# Patient Record
Sex: Female | Born: 1960
Health system: Southern US, Community
[De-identification: ages and names within clinical notes are randomized; demographics above are authoritative.]

## PROBLEM LIST (undated history)

## (undated) DIAGNOSIS — F32A Depression, unspecified: Secondary | ICD-10-CM

## (undated) DIAGNOSIS — R61 Generalized hyperhidrosis: Secondary | ICD-10-CM

## (undated) DIAGNOSIS — C801 Malignant (primary) neoplasm, unspecified: Secondary | ICD-10-CM

## (undated) DIAGNOSIS — F329 Major depressive disorder, single episode, unspecified: Secondary | ICD-10-CM

## (undated) DIAGNOSIS — M545 Low back pain, unspecified: Secondary | ICD-10-CM

## (undated) DIAGNOSIS — K219 Gastro-esophageal reflux disease without esophagitis: Secondary | ICD-10-CM

## (undated) DIAGNOSIS — O021 Missed abortion: Secondary | ICD-10-CM

## (undated) HISTORY — PX: COLONOSCOPY: SHX174

## (undated) HISTORY — DX: Generalized hyperhidrosis: R61

## (undated) HISTORY — DX: Depression, unspecified: F32.A

## (undated) HISTORY — DX: Major depressive disorder, single episode, unspecified: F32.9

## (undated) HISTORY — DX: Malignant (primary) neoplasm, unspecified: C80.1

## (undated) HISTORY — DX: Gastro-esophageal reflux disease without esophagitis: K21.9

## (undated) HISTORY — PX: WISDOM TOOTH EXTRACTION: SHX21

## (undated) HISTORY — PX: UPPER GASTROINTESTINAL ENDOSCOPY: SHX188

---

## 1982-12-06 HISTORY — PX: DILATION AND CURETTAGE OF UTERUS: SHX78

## 1997-02-19 HISTORY — PX: TUBAL LIGATION: SHX77

## 2009-08-02 ENCOUNTER — Ambulatory Visit: Payer: Self-pay | Admitting: Family Medicine

## 2009-08-02 DIAGNOSIS — F411 Generalized anxiety disorder: Secondary | ICD-10-CM | POA: Insufficient documentation

## 2010-02-02 ENCOUNTER — Ambulatory Visit: Payer: Self-pay | Admitting: Occupational Medicine

## 2010-07-07 ENCOUNTER — Ambulatory Visit: Payer: Self-pay | Admitting: Family Medicine

## 2010-07-07 DIAGNOSIS — F329 Major depressive disorder, single episode, unspecified: Secondary | ICD-10-CM

## 2010-07-07 DIAGNOSIS — G4700 Insomnia, unspecified: Secondary | ICD-10-CM | POA: Insufficient documentation

## 2010-07-07 DIAGNOSIS — F3289 Other specified depressive episodes: Secondary | ICD-10-CM | POA: Insufficient documentation

## 2010-09-22 ENCOUNTER — Ambulatory Visit: Payer: Self-pay | Admitting: Family Medicine

## 2011-01-05 NOTE — Assessment & Plan Note (Signed)
Summary: NOV: Anxiety, insomnia   Vital Signs:  Patient profile:   50 year old female Height:      63 inches Weight:      165 pounds BMI:     29.33 Pulse rate:   77 / minute BP sitting:   115 / 73  (left arm) Cuff size:   regular  Vitals Entered By: Avon Gully CMA, Duncan Dull) (July 07, 2010 2:14 PM) CC: NP est care   CC:  NP est care.  History of Present Illness: Has started counseling for anxiety and stress. Was on Lexapro for awhile and did well on it. But came off of it last October. Has had alot of stressors over the alst 2 years. Not sleeping well.  Getting severe HA. Husband snores and this keeps her awake. Will often get 2-3 hours of sleep.  Does feel depressed as well. She thinks she might want to go back on the Lexapro.   Habits & Providers  Alcohol-Tobacco-Diet     Alcohol drinks/day: <1     Tobacco Status: quit     Year Quit: 2001  Exercise-Depression-Behavior     Does Patient Exercise: yes     Type of exercise: walking     STD Risk: never     Drug Use: no     Seat Belt Use: always  Allergies: No Known Drug Allergies  Past History:  Past Medical History: Anxiety Hx of melanoma Hx of goiter during pregnancy, now resolved.   Past Surgical History: Tubal ligation 02/1997  Family History: Mother,D,COPD- she was a smoker.  Siblings with alcoholism Father with stroke, melanoma  Social History: Self employed.  [employer].  Some college.  Married to Ed wtih 3 kids.   Former Smoker, 2001 Alcohol use-yes occ. Drug use-no Regular exercise-yes 2-3 caffeinated drinks per day. Does Patient Exercise:  yes STD Risk:  never Seat Belt Use:  always  Review of Systems       No fever/sweats/weakness, unexplained weight loss/gain.  No vison changes.  No difficulty hearing/ringing in ears, + hay fever/allergies.  No chest pain/discomfort, palpitations.  No Br lump/nipple discharge.  No cough/wheeze.  No blood in BM, nausea/vomiting/diarrhea.  No nighttime  urination, leaking urine, unusual vaginal bleeding, discharge (penis or vagina).  No muscle/joint pain. No rash, change in mole.  + HA, no memory loss.  + anxiety, no sleep d/o, depression.  No easy bruising/bleeding, unexplained lump   Physical Exam  General:  Well-developed,well-nourished,in no acute distress; alert,appropriate and cooperative throughout examination Neck:  No deformities, masses, or tenderness noted. ON TM.  Lungs:  Normal respiratory effort, chest expands symmetrically. Lungs are clear to auscultation, no crackles or wheezes. Heart:  Normal rate and regular rhythm. S1 and S2 normal without gallop, murmur, click, rub or other extra sounds. Skin:  no rashes.   Cervical Nodes:  No lymphadenopathy noted Psych:  Cognition and judgment appear intact. Alert and cooperative with normal attention span and concentration. No apparent delusions, illusions, hallucinations   Impression & Recommendations:  Problem # 1:  ANXIETY (ICD-300.00) GAD- & score of 15  PHQ-9 score 21 (severe). I strongly encouraged her to restart her citalopram. Start with one half a day for one week and then increase to a whol. F/u in 3-4 weeks to make sure she is improving.  Continue seeing counseling as she feels this is really helping her.   Her updated medication list for this problem includes:    Citalopram Hydrobromide 20 Mg Tabs (Citalopram  hydrobromide) .Marland Kitchen... Take 1 tablet by mouth once a day  Problem # 2:  INSOMNIA (ICD-780.52) Reviewed sleep hygiene. Will restart her lexapro and see if this improves.It did  in the past with the lexapro. If not then consider other sleep aids.  She is also having hot flashes that are contributing to her insomnia. Can consider HRT as well.   Problem # 3:  DEPRESSION, SEVERE (ICD-311) See anxiety.  Her updated medication list for this problem includes:    Citalopram Hydrobromide 20 Mg Tabs (Citalopram hydrobromide) .Marland Kitchen... Take 1 tablet by mouth once a day  Complete  Medication List: 1)  Citalopram Hydrobromide 20 Mg Tabs (Citalopram hydrobromide) .... Take 1 tablet by mouth once a day 2)  Naproxen 500 Mg Tabs (Naproxen) .... One by mouth two times a day pc 3)  Lortab 5 5-500 Mg Tabs (Hydrocodone-acetaminophen) .... One or two tabs by mouth hs as needed pain 4)  Proair Hfa 108 (90 Base) Mcg/act Aers (Albuterol sulfate) .... Two inhalations q4-6hr as needed.  max 12 puffs/day  Patient Instructions: 1)  Call [phone] to schedule her mammogram. 2)  Please schedule a follow-up appointment in 3-4 weeks.  Prescriptions: CITALOPRAM HYDROBROMIDE 20 MG TABS (CITALOPRAM HYDROBROMIDE) Take 1 tablet by mouth once a day  #30 x 1   Entered and Authorized by:   Nani Gasser MD   Signed by:   Nani Gasser MD on 07/07/2010   Method used:   Electronically to        [facility]* (retail)       [address] [place], [place]  [address]       Ph: [phone]       Fax: [phone]   RxID:   [id number]

## 2011-01-05 NOTE — Assessment & Plan Note (Signed)
Summary: COUGH/KH   Vital Signs:  Patient Profile:   50 Years Old Female CC:      Cough x 2 days Height:     63 inches Weight:      160 pounds O2 Sat:      96 % O2 treatment:    Room Air Temp:     97.2 degrees F oral Pulse rate:   79 / minute Pulse rhythm:   regular Resp:     18 per minute BP sitting:   102 / 70  (right arm) Cuff size:   large  Vitals Entered By: Emilio Math (February 02, 2010 8:33 AM)                  Current Allergies (reviewed today): No known allergies History of Present Illness Chief Complaint: Cough x 2 days History of Present Illness: Presents with complaints of dry cough for the last 2 days.  No reports of fever.  Complatins of generalized body aches.   Compalints of ches tightness.   No wheezing.   No sinus congestion.  No ear pain.   No nausea, vomiting, diarrhea.    Coughing keeping her up at night.   No history of allergies or asthma.     REVIEW OF SYSTEMS Constitutional Symptoms      Denies fever, chills, night sweats, weight loss, weight gain, and fatigue.  Eyes       Denies change in vision, eye pain, eye discharge, glasses, contact lenses, and eye surgery. Ear/Nose/Throat/Mouth       Denies hearing loss/aids, change in hearing, ear pain, ear discharge, dizziness, frequent runny nose, frequent nose bleeds, sinus problems, sore throat, hoarseness, and tooth pain or bleeding.  Respiratory       Complains of dry cough.      Denies productive cough, wheezing, shortness of breath, asthma, bronchitis, and emphysema/COPD.  Cardiovascular       Denies murmurs, chest pain, and tires easily with exhertion.    Gastrointestinal       Denies stomach pain, nausea/vomiting, diarrhea, constipation, blood in bowel movements, and indigestion. Genitourniary       Denies painful urination, kidney stones, and loss of urinary control. Neurological       Denies paralysis, seizures, and fainting/blackouts. Musculoskeletal       Denies muscle pain, joint  pain, joint stiffness, decreased range of motion, redness, swelling, muscle weakness, and gout.  Skin       Denies bruising, unusual mles/lumps or sores, and hair/skin or nail changes.  Psych       Denies mood changes, temper/anger issues, anxiety/stress, speech problems, depression, and sleep problems.  Past History:  Past Medical History: Reviewed history from 08/02/2009 and no changes required. Anxiety  Past Surgical History: Reviewed history from 08/02/2009 and no changes required. Denies surgical history  Family History: Family History of Skin cancer Mother,D,COPD  Social History: Reviewed history from 08/02/2009 and no changes required. Former Smoker Alcohol use-yes occ. Drug use-no Physical Exam General appearance: well developed, well nourished, no acute distress Nasal: mucosa pink, nonedematous, no septal deviation, turbinates normal Oral/Pharynx: tongue normal, posterior pharynx without erythema or exudate Neck: neck supple,  trachea midline, no masses Chest/Lungs: no rales, wheezes, or rhonchi bilateral, breath sounds equal without effort Heart: regular rate and  rhythm, no murmur Assessment New Problems: BRONCHITIS, ACUTE (ICD-466.0)   Plan New Medications/Changes: PROAIR HFA 108 (90 BASE) MCG/ACT AERS (ALBUTEROL SULFATE) Two inhalations q4-6hr as needed.  Max 12 puffs/day  #1  MDI x 0, 02/02/2010, Kathrine Haddock MD CHERATUSSIN AC 100-10 MG/5ML SYRP (GUAIFENESIN-CODEINE) 5cc by mouth hs as needed cough  #2 oz x 0, 02/02/2010, Kathrine Haddock MD  New Orders: Est. Patient Level III [phone] Planning Comments:   albuterol as needed Cheratussin for cough   The patient and/or caregiver has been counseled thoroughly with regard to medications prescribed including dosage, schedule, interactions, rationale for use, and possible side effects and they verbalize understanding.  Diagnoses and expected course of recovery discussed and will return if not improved as  expected or if the condition worsens. Patient and/or caregiver verbalized understanding.  Prescriptions: PROAIR HFA 108 (90 BASE) MCG/ACT AERS (ALBUTEROL SULFATE) Two inhalations q4-6hr as needed.  Max 12 puffs/day  #1 MDI x 0   Entered and Authorized by:   Kathrine Haddock MD   Signed by:   Kathrine Haddock MD on 02/02/2010   Method used:   Print then Give to Patient   RxID:   [id number] CHERATUSSIN AC 100-10 MG/5ML SYRP (GUAIFENESIN-CODEINE) 5cc by mouth hs as needed cough  #2 oz x 0   Entered and Authorized by:   Kathrine Haddock MD   Signed by:   Kathrine Haddock MD on 02/02/2010   Method used:   Print then Give to Patient   RxID:   [id number]   Patient Instructions: 1)  Acute bronchitis symptoms for less than 10 days are not helped by antibiotics. take over the counter cough medications. call if no improvment in  5-7 days, sooner if increasing cough, fever, or new symptoms( shortness of breath, chest pain).

## 2011-01-05 NOTE — Assessment & Plan Note (Signed)
Summary: f/u on mood   Vital Signs:  Patient profile:   50 year old female Height:      63 inches Weight:      162 pounds Pulse rate:   87 / minute BP sitting:   101 / 69  (right arm) Cuff size:   regular  Vitals Entered By: Avon Gully CMA, Duncan Dull) (September 22, 2010 1:35 PM) CC: f/u mood, pt states med is working   CC:  f/u mood and pt states med is working.  History of Present Illness: f/u mood, pt states med is working well. No side effects. Have noticed im,provment in her mood.   Current Medications (verified): 1)  Citalopram Hydrobromide 20 Mg Tabs (Citalopram Hydrobromide) .... Take 1 Tablet By Mouth Once A Day 2)  Naproxen 500 Mg Tabs (Naproxen) .... One By Mouth Two Times A Day Pc 3)  Lortab 5 5-500 Mg Tabs (Hydrocodone-Acetaminophen) .... One or Two Tabs By Mouth Hs As Needed Pain  Allergies (verified): No Known Drug Allergies  Comments:  Nurse/Medical Assistant: The patient's medications and allergies were reviewed with the patient and were updated in the Medication and Allergy Lists. Avon Gully CMA, Duncan Dull) (September 22, 2010 1:35 PM)  Physical Exam  General:  Well-developed,well-nourished,in no acute distress; alert,appropriate and cooperative throughout examination Lungs:  Normal respiratory effort, chest expands symmetrically. Lungs are clear to auscultation, no crackles or wheezes. Heart:  Normal rate and regular rhythm. S1 and S2 normal without gallop, murmur, click, rub or other extra sounds. Skin:  no rashes.   Cervical Nodes:  No lymphadenopathy noted Psych:  Cognition and judgment appear intact. Alert and cooperative with normal attention span and concentration. No apparent delusions, illusions, hallucinations   Impression & Recommendations:  Problem # 1:  DEPRESSION, SEVERE (ICD-311) PHQ -9 score is 8 , down from 21.  F/u in 2 months.  Discussed option. Will inc her dose to 40mg .  Sleep is still poor but husband snores very loudly.   Her  updated medication list for this problem includes:    Citalopram Hydrobromide 40 Mg Tabs (Citalopram hydrobromide) .Marland Kitchen... Take 1 tablet by mouth once a day  Problem # 2:  ANXIETY (ICD-300.00) GAD - 7 score is 7.  Imprved from last time. INc dose to 40mg  and f/u in 2 months.  Her updated medication list for this problem includes:    Citalopram Hydrobromide 40 Mg Tabs (Citalopram hydrobromide) .Marland Kitchen... Take 1 tablet by mouth once a day  Complete Medication List: 1)  Citalopram Hydrobromide 40 Mg Tabs (Citalopram hydrobromide) .... Take 1 tablet by mouth once a day 2)  Naproxen 500 Mg Tabs (Naproxen) .... One by mouth two times a day pc 3)  Lortab 5 5-500 Mg Tabs (Hydrocodone-acetaminophen) .... One or two tabs by mouth hs as needed pain  Patient Instructions: 1)  Please schedule a follow-up appointment in 2 months for mood.   Contraindications/Deferment of Procedures/Staging:    Test/Procedure: FLU VAX    Reason for deferment: patient declined  Prescriptions: CITALOPRAM HYDROBROMIDE 40 MG TABS (CITALOPRAM HYDROBROMIDE) Take 1 tablet by mouth once a day  #90 x 0   Entered and Authorized by:   Nani Gasser MD   Signed by:   Nani Gasser MD on 09/22/2010   Method used:   Electronically to        [facility]* (retail)       [address] [place], [place]  [address]  Ph: [phone]       Fax: [phone]   RxID:   [id number]    Orders Added: 1)  Est. Patient Level III [62952]

## 2011-01-05 NOTE — Letter (Signed)
Summary: Depression & Anxiety Questionnaire  Depression & Anxiety Questionnaire   Imported By: Lanelle Bal 07/23/2010 12:24:11  _____________________________________________________________________  External Attachment:    Type:   Image     Comment:   External Document

## 2011-01-05 NOTE — Letter (Signed)
Summary: Depression & Anxiety Questionnaire  Depression & Anxiety Questionnaire   Imported By: Lanelle Bal 10/01/2010 11:17:20  _____________________________________________________________________  External Attachment:    Type:   Image     Comment:   External Document

## 2011-05-08 ENCOUNTER — Other Ambulatory Visit: Payer: Self-pay | Admitting: Family Medicine

## 2011-06-15 ENCOUNTER — Other Ambulatory Visit: Payer: Self-pay | Admitting: *Deleted

## 2013-01-16 ENCOUNTER — Encounter: Payer: Self-pay | Admitting: Obstetrics & Gynecology

## 2013-01-18 ENCOUNTER — Encounter: Payer: Self-pay | Admitting: Obstetrics & Gynecology

## 2013-02-07 ENCOUNTER — Encounter: Payer: Self-pay | Admitting: Obstetrics & Gynecology

## 2013-02-13 ENCOUNTER — Encounter: Payer: Self-pay | Admitting: Obstetrics & Gynecology

## 2013-02-22 ENCOUNTER — Encounter: Payer: Self-pay | Admitting: Obstetrics & Gynecology

## 2013-02-22 ENCOUNTER — Ambulatory Visit (INDEPENDENT_AMBULATORY_CARE_PROVIDER_SITE_OTHER): Payer: BC Managed Care – PPO | Admitting: Obstetrics & Gynecology

## 2013-02-22 VITALS — BP 122/79 | HR 80 | Resp 16 | Ht 63.0 in | Wt 167.0 lb

## 2013-02-22 DIAGNOSIS — Z1151 Encounter for screening for human papillomavirus (HPV): Secondary | ICD-10-CM

## 2013-02-22 DIAGNOSIS — Z124 Encounter for screening for malignant neoplasm of cervix: Secondary | ICD-10-CM

## 2013-02-22 DIAGNOSIS — N939 Abnormal uterine and vaginal bleeding, unspecified: Secondary | ICD-10-CM

## 2013-02-22 DIAGNOSIS — N926 Irregular menstruation, unspecified: Secondary | ICD-10-CM

## 2013-02-22 DIAGNOSIS — Z01419 Encounter for gynecological examination (general) (routine) without abnormal findings: Secondary | ICD-10-CM

## 2013-02-22 NOTE — Patient Instructions (Addendum)
Postmenopausal Bleeding Menopause is commonly referred to as the "change in life." It is a time when the fertile years, the time of ovulating and having menstrual periods, has come to an end. It is also determined by not having menstrual periods for 12 months.  Postmenopausal bleeding is any bleeding a woman has after she has entered into menopause. Any type of postmenopausal bleeding, even if it appears to be a typical menstrual period, is concerning. This should be evaluated by your caregiver.  CAUSES   Hormone therapy.  Cancer of the cervix or cancer of the lining of the uterus (endometrial cancer).  Thinning of the uterine lining (uterine atrophy).  Thyroid diseases.  Certain medicines.  Infection of the uterus or cervix.  Inflammation or irritation of the uterine lining (endometritis).  Estrogen-secreting tumors.  Growths (polyps) on the cervix, uterine lining, or uterus.  Uterine tumors (fibroids).  Being very overweight (obese). DIAGNOSIS  Your caregiver will take a medical history and ask questions. A physical exam will also be performed. Further tests may include:   A transvaginal ultrasound. An ultrasound wand or probe is inserted into your vagina to view the pelvic organs.  A biopsy of the lining of the uterus (endometrium). A sample of the endometrium is removed and examined.  A hysteroscopy. Your caregiver may use an instrument with a light and a camera attached to it (hysteroscope). The hysteroscope is used to look inside the uterus for problems.  A dilation and curettage (D&C). Tissue is removed from the uterine lining to be examined for problems. TREATMENT  Treatment depends on the cause of the bleeding. Some treatments include:   Surgery.  Medicines.  Hormones.  A hysteroscopy or D&C to remove polyps or fibroids.  Changing or stopping a current medicine you are taking. Talk to your caregiver about your specific treatment. HOME CARE INSTRUCTIONS    Maintain a healthy weight.  Keep regular pelvic exams and Pap tests. SEEK MEDICAL CARE IF:   You have bleeding, even if it is light in comparison to your previous periods.  Your bleeding lasts more than 1 week.  You have abdominal pain.  You develop bleeding with sexual intercourse. SEEK IMMEDIATE MEDICAL CARE IF:   You have a fever, chills, headache, dizziness, muscle aches, and bleeding.  You have severe pain with bleeding.  You are passing blood clots.  You have bleeding and need more than 1 pad an hour.  You feel faint. MAKE SURE YOU:  Understand these instructions.  Will watch your condition.  Will get help right away if you are not doing well or get worse. Document Released: 03/02/2006 Document Revised: 02/14/2012 Document Reviewed: 07/29/2011 ExitCare Patient Information 2013 [employer].  

## 2013-02-22 NOTE — Progress Notes (Signed)
  Subjective:     Miranda Castro is a 52 y.o. female here for a routine exam.  Current complaints: heavy vaginal bleeding for one month, perimenopausal symptoms, post coital bleeding.  Personal health questionnaire reviewed: yes.   Gynecologic History Patient's last menstrual period was 01/19/2013. Contraception: perimenopausal stauts Last Pap: 2009. Results were: normal Last mammogram: 2009. Results were: normal  Obstetric History OB History   Grav Para Term Preterm Abortions TAB SAB Ect Mult Living   4 1  1      3      # Outc Date GA Lbr Len/2nd Wgt Sex Del Anes PTL Lv   1 PRE            2 GRA            3 GRA            4 GRA                The following portions of the patient's history were reviewed and updated as appropriate: allergies, current medications, past family history, past medical history, past social history, past surgical history and problem list.  Review of Systems Pertinent items are noted in HPI.    Objective:   Filed Vitals:   02/22/13 1527  BP: 122/79  Pulse: 80  Resp: 16  Height: 5\' 3"  (1.6 m)  Weight: 167 lb (75.751 kg)      Vitals:  WNL General appearance: alert, cooperative and no distress Head: Normocephalic, without obvious abnormality, atraumatic Eyes: negative Throat: lips, mucosa, and tongue normal; teeth and gums normal Lungs: clear to auscultation bilaterally Breasts: normal appearance, no masses or tenderness, No nipple retraction or dimpling, No nipple discharge or bleeding Heart: regular rate and rhythm Abdomen: soft, non-tender; bowel sounds normal; no masses,  no organomegaly Pelvic: cervix has mass from 1-3 o;clock, feels hard on palpation, external genitalia normal, no adnexal masses or tenderness, no bladder tenderness, no cervical motion tenderness, perianal skin: no external genital warts noted, , urethra without abnormality or discharge, uterus normal size, shape, and consistency (retroverted) and vagina normal without  discharge Extremities: no edema, redness or tenderness in the calves or thighs Skin: no lesions or rash, evidence of skin damage; pt has dermatologist that does skin surveys Lymph nodes: Axillary adenopathy: none        Assessment:   Routine gyn exam.   Cervix with mass abnml uterine bleeding   Plan:    Mammogram ordered. Follow up in: 1 week. Needs cervical biopsy / colpo and endometrial biopsy TV US

## 2013-02-23 ENCOUNTER — Encounter: Payer: Self-pay | Admitting: Obstetrics & Gynecology

## 2013-02-23 DIAGNOSIS — N939 Abnormal uterine and vaginal bleeding, unspecified: Secondary | ICD-10-CM | POA: Insufficient documentation

## 2013-02-26 NOTE — Addendum Note (Signed)
Addended by: Granville Lewis on: 02/26/2013 11:27 AM   Modules accepted: Orders

## 2013-02-27 ENCOUNTER — Ambulatory Visit (HOSPITAL_COMMUNITY)
Admission: RE | Admit: 2013-02-27 | Discharge: 2013-02-27 | Disposition: A | Discharge status: Home / Self Care | Payer: BC Managed Care – PPO | Source: Ambulatory Visit | Attending: Obstetrics & Gynecology | Admitting: Obstetrics & Gynecology

## 2013-02-27 DIAGNOSIS — Z01419 Encounter for gynecological examination (general) (routine) without abnormal findings: Secondary | ICD-10-CM

## 2013-02-27 DIAGNOSIS — N83209 Unspecified ovarian cyst, unspecified side: Secondary | ICD-10-CM | POA: Insufficient documentation

## 2013-02-27 DIAGNOSIS — Z1231 Encounter for screening mammogram for malignant neoplasm of breast: Secondary | ICD-10-CM | POA: Insufficient documentation

## 2013-02-27 DIAGNOSIS — D252 Subserosal leiomyoma of uterus: Secondary | ICD-10-CM | POA: Insufficient documentation

## 2013-02-27 DIAGNOSIS — N920 Excessive and frequent menstruation with regular cycle: Secondary | ICD-10-CM | POA: Insufficient documentation

## 2013-02-27 DIAGNOSIS — N939 Abnormal uterine and vaginal bleeding, unspecified: Secondary | ICD-10-CM

## 2013-02-27 DIAGNOSIS — N854 Malposition of uterus: Secondary | ICD-10-CM | POA: Insufficient documentation

## 2013-03-01 ENCOUNTER — Encounter: Payer: Self-pay | Admitting: Obstetrics & Gynecology

## 2013-03-01 ENCOUNTER — Ambulatory Visit (INDEPENDENT_AMBULATORY_CARE_PROVIDER_SITE_OTHER): Payer: BC Managed Care – PPO | Admitting: Obstetrics & Gynecology

## 2013-03-01 VITALS — BP 119/67 | HR 68 | Resp 16 | Wt 166.0 lb

## 2013-03-01 DIAGNOSIS — N920 Excessive and frequent menstruation with regular cycle: Secondary | ICD-10-CM

## 2013-03-01 DIAGNOSIS — R87619 Unspecified abnormal cytological findings in specimens from cervix uteri: Secondary | ICD-10-CM | POA: Insufficient documentation

## 2013-03-01 DIAGNOSIS — N841 Polyp of cervix uteri: Secondary | ICD-10-CM

## 2013-03-01 DIAGNOSIS — Z01812 Encounter for preprocedural laboratory examination: Secondary | ICD-10-CM

## 2013-03-01 DIAGNOSIS — Z7689 Persons encountering health services in other specified circumstances: Secondary | ICD-10-CM

## 2013-03-01 DIAGNOSIS — N83201 Unspecified ovarian cyst, right side: Secondary | ICD-10-CM | POA: Insufficient documentation

## 2013-03-01 MED ORDER — MEGESTROL ACETATE 40 MG PO TABS: ORAL_TABLET | ORAL | Status: DC

## 2013-03-01 NOTE — Progress Notes (Signed)
  Endometrial Biopsy Procedure Note  Pre-operative Diagnosis: 52 yo female with cervical mass and menorrhagia  Post-operative Diagnosis: cervical Nabothian cyst and menorrhagia  Indications: abnormal uterine bleeding  Procedure Details   Urine pregnancy test was not done.  The risks (including infection, bleeding, pain, and uterine perforation) and benefits of the procedure were explained to the patient and Written informed consent was obtained.  Antibiotic prophylaxis against endocarditis was not indicated.   The patient was placed in the dorsal lithotomy position.  Bimanual exam showed the uterus to be in the retroflexed position.  A Graves' speculum inserted in the vagina, and the cervix prepped with povidone iodine.  Endocervical curettage with a Kevorkian curette was not performed.   A sharp tenaculum was applied to the anterior lip of the cervix for stabilization.  A sterile uterine sound was used to sound the uterus to a depth of 9.5cm.  A Pipelle endometrial aspirator was used to sample the endometrium.  Sample was sent for pathologic examination.  Condition: Stable  Complications: None  Plan:  The patient was advised to call for any fever or for prolonged or severe pain or bleeding. She was advised to use OTC ibuprofen as needed for mild to moderate pain. She was advised to avoid vaginal intercourse for 48 hours or until the bleeding has completely stopped.  TSH, CBC, Megace  Attending Physician Documentation: I was present for or participated in the entire procedure, including opening and closing.

## 2013-03-01 NOTE — Patient Instructions (Signed)
Place endometrial biopsy patient instructions here.  

## 2013-03-02 ENCOUNTER — Telehealth: Payer: Self-pay | Admitting: *Deleted

## 2013-03-02 LAB — TSH: TSH: 2.625 u[IU]/mL (ref 0.350–4.500)

## 2013-03-02 NOTE — Telephone Encounter (Signed)
Called pt to adv TSH level is good. LMOM

## 2013-03-07 ENCOUNTER — Ambulatory Visit (INDEPENDENT_AMBULATORY_CARE_PROVIDER_SITE_OTHER): Payer: BC Managed Care – PPO | Admitting: Obstetrics & Gynecology

## 2013-03-07 ENCOUNTER — Encounter: Payer: Self-pay | Admitting: Obstetrics & Gynecology

## 2013-03-07 VITALS — BP 112/80 | HR 69 | Resp 16 | Ht 62.0 in | Wt 165.0 lb

## 2013-03-07 DIAGNOSIS — N83209 Unspecified ovarian cyst, unspecified side: Secondary | ICD-10-CM | POA: Insufficient documentation

## 2013-03-07 MED ORDER — MEDROXYPROGESTERONE ACETATE 10 MG PO TABS: ORAL_TABLET | ORAL | Status: DC

## 2013-03-07 NOTE — Progress Notes (Signed)
  Subjective:    Patient ID: Miranda Castro, female    DOB: 06-22-1961, 52 y.o.   MRN: [id number]  HPI  52 yo female presents for results and plan of care.  Pt had a prolonged episode of bleeding (6 weeks) which just stopped 3 days ago.  Pt underwent endometrial biopsy, cervical biopsy and pap smear, TV US, and mammogram.  Pt has benign pathology, Birads 1 mammogram, and 2 probable simple cysts on the right ovary (less likely one cyst with a septum).  Pt also has several small fibroids.  Pt does have back pain over her left sacrum.     Review of Systems     Objective:   Physical Exam  Vitals reviewed. Constitutional: She appears well-developed and well-nourished. No distress.  HENT:  Head: Normocephalic and atraumatic.  Eyes: Conjunctivae are normal.  Abdominal: Soft.  Musculoskeletal:  Pain over left sacrum.  No CVA tenderness.  No pelvic pain.  Skin: Skin is warm and dry.  Psychiatric: She has a normal mood and affect.          Assessment & Plan:  52 yo female with DUB and probable simple ovarian cyst.  1-Provera for 10 days this month starting April 12th 2-F/U US for ovarian cyst 3-F/U with PCP for back pain. 4-Calcium and Vit D, Wt bearing exercise.

## 2013-03-12 ENCOUNTER — Ambulatory Visit (INDEPENDENT_AMBULATORY_CARE_PROVIDER_SITE_OTHER): Payer: BC Managed Care – PPO | Admitting: Family Medicine

## 2013-03-12 ENCOUNTER — Encounter: Payer: Self-pay | Admitting: Family Medicine

## 2013-03-12 VITALS — BP 109/72 | HR 66 | Wt 165.0 lb

## 2013-03-12 DIAGNOSIS — M533 Sacrococcygeal disorders, not elsewhere classified: Secondary | ICD-10-CM

## 2013-03-12 DIAGNOSIS — M76899 Other specified enthesopathies of unspecified lower limb, excluding foot: Secondary | ICD-10-CM

## 2013-03-12 DIAGNOSIS — M7062 Trochanteric bursitis, left hip: Secondary | ICD-10-CM

## 2013-03-12 MED ORDER — MELOXICAM 7.5 MG PO TABS: 7.5000 mg | ORAL_TABLET | Freq: Every day | ORAL | Status: DC

## 2013-03-12 NOTE — Patient Instructions (Addendum)
  Start mobic daly for inflammation.   Please schedule an appt with Dr. Benjamin Stain.         Hip Bursitis Bursitis is a swelling and soreness (inflammation) of a fluid-filled sac (bursa). This sac overlies and protects the joints.  CAUSES   Injury.  Overuse of the muscles surrounding the joint.  Arthritis.  Gout.  Infection.  Cold weather.  Inadequate warm-up and conditioning prior to activities. The cause may not be known.  SYMPTOMS   Mild to severe irritation.  Tenderness and swelling over the outside of the hip.  Pain with motion of the hip.  If the bursa becomes infected, a fever may be present. Redness, tenderness, and warmth will develop over the hip. Symptoms usually lessen in 3 to 4 weeks with treatment, but can come back. TREATMENT If conservative treatment does not work, your caregiver may advise draining the bursa and injecting cortisone into the area. This may speed up the healing process. This may also be used as an initial treatment of choice. HOME CARE INSTRUCTIONS   Apply ice to the affected area for 15 to 20 minutes every 3 to 4 hours while awake for the first 2 days. Put the ice in a plastic bag and place a towel between the bag of ice and your skin.  Rest the painful joint as much as possible, but continue to put the joint through a normal range of motion at least 4 times per day. When the pain lessens, begin normal, slow movements and usual activities to help prevent stiffness of the hip.  Only take over-the-counter or prescription medicines for pain, discomfort, or fever as directed by your caregiver.  Use crutches to limit weight bearing on the hip joint, if advised.  Elevate your painful hip to reduce swelling. Use pillows for propping and cushioning your legs and hips.  Gentle massage may provide comfort and decrease swelling. SEEK IMMEDIATE MEDICAL CARE IF:   Your pain increases even during treatment, or you are not improving.  You  have a fever.  You have heat and inflammation over the involved bursa.  You have any other questions or concerns. MAKE SURE YOU:   Understand these instructions.  Will watch your condition.  Will get help right away if you are not doing well or get worse. Document Released: 05/14/2002 Document Revised: 02/14/2012 Document Reviewed: 12/11/2008 [facility] Patient Information 2013 [place], [place].

## 2013-03-12 NOTE — Progress Notes (Signed)
  Subjective:    Patient ID: Miranda Castro, female    DOB: 08-19-1961, 52 y.o.   MRN: [id number]  HPI pt c/o lower back pain for months that radiates to her left hip and down her leg to upper left outer thigh. it is worse in the morning upon waking. she has used tylenol, aleve, stretching, and heat. Only minimal relief from these therapies. No hx of fracture or surgery.  It will actually wake her up at night.  No other worsening or alleviating symptoms. Sometimes she does sleep with her left leg tucked under her right to take a little pressure off her low back. Typically she sleeps on her right side. Says she's not sure if it's painful to sleep on the left hip or not. It does not cause any difficulty with her gait. No old injuries or surgeries.    Review of Systems     Objective:   Physical Exam  Constitutional: She is oriented to person, place, and time. She appears well-developed and well-nourished.  HENT:  Head: Normocephalic and atraumatic.  Eyes: Conjunctivae and EOM are normal.  Cardiovascular: Normal rate.   Pulmonary/Chest: Effort normal.  Musculoskeletal:  Lumbar flexion and extension is normal. Rotation right left is normal. She did have some pain with extension with stork sign to the left. She is very tender over the left SI joint. Nontender over the lumbar spine. She's also very tender over the left greater trochanter. Hip with normal range of motion. She did have some pain over her low back with FABER test. Hip, knee, ankle strength is 5 out of 5.  Neurological: She is alert and oriented to person, place, and time.  Skin: Skin is dry. No pallor.  Psychiatric: She has a normal mood and affect. Her behavior is normal.          Assessment & Plan:  SI joint inflammation - discussed with the mainstay of treatment is physical therapy and NSAIDs. Handout given on exercises and prescription given for Mobic. I would like her to see my partner Dr. Rodney Langton in the  next couple weeks if she's not improving for possible injection.  Left trochanteric bursitis - again treatment is physical therapy and NSAIDs. If she's not improving then consider injection for pain relief. Handout given.

## 2013-04-04 ENCOUNTER — Ambulatory Visit (INDEPENDENT_AMBULATORY_CARE_PROVIDER_SITE_OTHER): Payer: BC Managed Care – PPO

## 2013-04-04 ENCOUNTER — Encounter: Payer: Self-pay | Admitting: Sports Medicine

## 2013-04-04 ENCOUNTER — Ambulatory Visit (INDEPENDENT_AMBULATORY_CARE_PROVIDER_SITE_OTHER): Payer: BC Managed Care – PPO | Admitting: Sports Medicine

## 2013-04-04 VITALS — BP 118/77 | HR 69 | Wt 167.0 lb

## 2013-04-04 DIAGNOSIS — M545 Low back pain, unspecified: Secondary | ICD-10-CM

## 2013-04-04 DIAGNOSIS — IMO0001 Reserved for inherently not codable concepts without codable children: Secondary | ICD-10-CM

## 2013-04-04 MED ORDER — PREDNISONE 50 MG PO TABS: ORAL_TABLET | ORAL | Status: DC

## 2013-04-04 MED ORDER — MELOXICAM 15 MG PO TABS: ORAL_TABLET | ORAL | Status: DC

## 2013-04-04 NOTE — Progress Notes (Signed)
   Subjective:    I'm seeing this patient as a consultation for:  Dr. Linford Arnold  CC: Left-sided back pain  HPI: This is a very pleasant 52 year old female who comes in with a several year history of pain which he localizes in the left side of her low back, she denies any discrete injuries. Pain is worse with weightbearing, but occasionally worse with sitting, Valsalva, and riding in a car. The pain does radiate the lateral aspect of her thigh but not past the knee. She using some Mobic which is minimally effective. She has not had any formal physical therapy or any advanced imaging. Pain is mild to moderate. Stable.  Past medical history, Surgical history, Family history not pertinant except as noted below, Social history, Allergies, and medications have been entered into the medical record, reviewed, and no changes needed.   Review of Systems: No headache, visual changes, nausea, vomiting, diarrhea, constipation, dizziness, abdominal pain, skin rash, fevers, chills, night sweats, weight loss, swollen lymph nodes, body aches, joint swelling, muscle aches, chest pain, shortness of breath, mood changes, visual or auditory hallucinations.   Objective:   General: Well Developed, well nourished, and in no acute distress.  Neuro/Psych: Alert and oriented x3, extra-ocular muscles intact, able to move all 4 extremities, sensation grossly intact. Skin: Warm and dry, no rashes noted.  Respiratory: Not using accessory muscles, speaking in full sentences, trachea midline.  Cardiovascular: Pulses palpable, no extremity edema. Abdomen: Does not appear distended. Back Exam:  Inspection: Unremarkable  Motion: Flexion 45 deg, Extension 45 deg, Side Bending to 45 deg bilaterally,  Rotation to 45 deg bilaterally  SLR laying: Negative  XSLR laying: Negative  Palpable tenderness: Left sacroiliac joint just medial to the posterior superior iliac spine. FABER: negative. Sensory change: Gross sensation intact to  all lumbar and sacral dermatomes.  Reflexes: 2+ at both patellar tendons, 2+ at achilles tendons, Babinski's downgoing.  Strength at foot  Plantar-flexion: 5/5 Dorsi-flexion: 5/5 Eversion: 5/5 Inversion: 5/5  Leg strength  Quad: 5/5 Hamstring: 5/5 Hip flexor: 5/5 Hip abductors: 5/5  Gait unremarkable. Patrick's test is positive.  Procedure: Real-time Ultrasound Guided Injection of left sacroiliac joint  Device: GE Logiq E  Ultrasound guided injection is preferred based studies that show increased duration, increased effect, greater accuracy, decreased procedural pain, increased response rate, and decreased cost with ultrasound guided versus blind injection.  Verbal informed consent obtained.  Time-out conducted.  Noted no overlying erythema, induration, or other signs of local infection.  Skin prepped in a sterile fashion.  Local anesthesia: Topical Ethyl chloride.  With sterile technique and under real time ultrasound guidance:  Spinal needle advanced just medial to the posterior superior iliac spine skin over the dorsum of the sacrum. Needle was felt to slip into the sacroiliac joint, 1 cc Kenalog 40, 4 cc lidocaine injected into the joint. Completed without difficulty  Pain did not immediately resolve after injection.  Advised to call if fevers/chills, erythema, induration, drainage, or persistent bleeding.  Images permanently stored and available for review in the ultrasound unit.  Impression: Technically successful ultrasound guided injection.  X-rays were reviewed and only show mild loss of disc space at the L5-S1 level. There is also some upper lumbar endplate spurring.  Impression and Recommendations:   This case required medical decision making of moderate complexity.

## 2013-04-04 NOTE — Assessment & Plan Note (Addendum)
Pain is predominantly axial and she does localize it over her left sacroiliac joint. Combined with a positive Patrick's test this places SI joint dysfunction high on the list. I injected the SI joint today. Her lack of immediate response to block of the left SI joint makes lumbar radiculitis a stronger possibility. She'll come back to see me in 4 weeks. I'm adding several days of prednisone, formal physical therapy, increasing Mobic to 15 mg daily. X-rays. If no better certainly we should further pursue the lumbar spine. Her pain with flexion, sitting, and with Valsalva does place L4-L5 discogenic pain in a close second on the differential

## 2013-04-09 ENCOUNTER — Ambulatory Visit: Payer: BC Managed Care – PPO | Admitting: Physical Therapy

## 2013-04-09 DIAGNOSIS — M6281 Muscle weakness (generalized): Secondary | ICD-10-CM

## 2013-04-09 DIAGNOSIS — M545 Low back pain, unspecified: Secondary | ICD-10-CM

## 2013-04-09 DIAGNOSIS — M25559 Pain in unspecified hip: Secondary | ICD-10-CM

## 2013-04-13 ENCOUNTER — Encounter: Payer: BC Managed Care – PPO | Admitting: Physical Therapy

## 2013-04-13 DIAGNOSIS — M545 Low back pain, unspecified: Secondary | ICD-10-CM

## 2013-04-13 DIAGNOSIS — M25559 Pain in unspecified hip: Secondary | ICD-10-CM

## 2013-04-13 DIAGNOSIS — M6281 Muscle weakness (generalized): Secondary | ICD-10-CM

## 2013-04-16 ENCOUNTER — Ambulatory Visit: Payer: BC Managed Care – PPO | Admitting: Physical Therapy

## 2013-04-16 DIAGNOSIS — M545 Low back pain, unspecified: Secondary | ICD-10-CM

## 2013-04-16 DIAGNOSIS — M6281 Muscle weakness (generalized): Secondary | ICD-10-CM

## 2013-04-16 DIAGNOSIS — M25559 Pain in unspecified hip: Secondary | ICD-10-CM

## 2013-04-19 ENCOUNTER — Encounter: Payer: BC Managed Care – PPO | Admitting: Physical Therapy

## 2013-04-23 ENCOUNTER — Encounter: Payer: BC Managed Care – PPO | Admitting: Physical Therapy

## 2013-04-23 DIAGNOSIS — M25559 Pain in unspecified hip: Secondary | ICD-10-CM

## 2013-04-23 DIAGNOSIS — M545 Low back pain, unspecified: Secondary | ICD-10-CM

## 2013-04-23 DIAGNOSIS — M6281 Muscle weakness (generalized): Secondary | ICD-10-CM

## 2013-05-01 ENCOUNTER — Encounter: Payer: BC Managed Care – PPO | Admitting: Physical Therapy

## 2013-05-02 ENCOUNTER — Ambulatory Visit (INDEPENDENT_AMBULATORY_CARE_PROVIDER_SITE_OTHER): Payer: BC Managed Care – PPO

## 2013-05-02 ENCOUNTER — Ambulatory Visit: Payer: BC Managed Care – PPO | Admitting: Sports Medicine

## 2013-05-02 DIAGNOSIS — D259 Leiomyoma of uterus, unspecified: Secondary | ICD-10-CM

## 2013-05-02 DIAGNOSIS — N83209 Unspecified ovarian cyst, unspecified side: Secondary | ICD-10-CM

## 2013-05-02 DIAGNOSIS — R9389 Abnormal findings on diagnostic imaging of other specified body structures: Secondary | ICD-10-CM

## 2013-05-08 ENCOUNTER — Telehealth: Payer: Self-pay | Admitting: *Deleted

## 2013-05-08 NOTE — Telephone Encounter (Signed)
Lm on voicemail that her RT ovarian cyst has resolved and just touching base with her to check on her bleeding pattern.

## 2013-05-10 ENCOUNTER — Ambulatory Visit (INDEPENDENT_AMBULATORY_CARE_PROVIDER_SITE_OTHER): Payer: BC Managed Care – PPO | Admitting: Obstetrics & Gynecology

## 2013-05-10 ENCOUNTER — Encounter: Payer: Self-pay | Admitting: Obstetrics & Gynecology

## 2013-05-10 VITALS — BP 111/71 | HR 68 | Resp 16 | Wt 162.0 lb

## 2013-05-10 DIAGNOSIS — N949 Unspecified condition associated with female genital organs and menstrual cycle: Secondary | ICD-10-CM

## 2013-05-10 DIAGNOSIS — N939 Abnormal uterine and vaginal bleeding, unspecified: Secondary | ICD-10-CM

## 2013-05-10 DIAGNOSIS — N926 Irregular menstruation, unspecified: Secondary | ICD-10-CM

## 2013-05-10 DIAGNOSIS — N938 Other specified abnormal uterine and vaginal bleeding: Secondary | ICD-10-CM

## 2013-05-10 MED ORDER — MEDROXYPROGESTERONE ACETATE 10 MG PO TABS: 10.0000 mg | ORAL_TABLET | Freq: Every day | ORAL | Status: DC

## 2013-05-10 MED ORDER — LEUPROLIDE ACETATE 3.75 MG IM KIT: 3.7500 mg | PACK | Freq: Once | INTRAMUSCULAR | Status: DC

## 2013-05-10 NOTE — Progress Notes (Signed)
Pt here for follow up from [place] and to discuss menstrual cycle.  The patient had a complex rt adnexal mass that has resolved on f/u US.  The pt is still having very heavy menses with clots.  The US shows a ? Polyp up to 1 cm.  This was not present at the last [place] but lining was thickened.  Pt's menses are prolonged with some spotting.  Given pt's symptoms will proceed with diagnotic hysteroscopy, possible polypectomy, and HTA.  Will discuss with patient premedicating with Lupron for one month to improve burn in uterus and to help with symptoms until procedure.  Reviewed medical history and medications. Pt consented for D & C, hysteroscopy, polypectomy, and hydrothermal endometrial ablation.  Risks include but not limited to bleeding, infection, damage to uterus, burn in the vagina.  SCDs placed for DVT prophylaxis.  All questions answered.

## 2013-05-11 ENCOUNTER — Ambulatory Visit: Payer: BC Managed Care – PPO

## 2013-05-14 ENCOUNTER — Ambulatory Visit (INDEPENDENT_AMBULATORY_CARE_PROVIDER_SITE_OTHER): Payer: BC Managed Care – PPO | Admitting: *Deleted

## 2013-05-14 VITALS — BP 110/74 | HR 68 | Resp 16 | Wt 162.0 lb

## 2013-05-14 DIAGNOSIS — N938 Other specified abnormal uterine and vaginal bleeding: Secondary | ICD-10-CM

## 2013-05-14 DIAGNOSIS — N949 Unspecified condition associated with female genital organs and menstrual cycle: Secondary | ICD-10-CM

## 2013-05-14 MED ORDER — LEUPROLIDE ACETATE 3.75 MG IM KIT
3.7500 mg | PACK | Freq: Once | INTRAMUSCULAR | Status: AC
  Administered 2013-05-14: 3.75 mg via INTRAMUSCULAR

## 2013-05-22 SURGERY — Surgical Case
Anesthesia: *Unknown

## 2013-06-13 ENCOUNTER — Encounter (HOSPITAL_COMMUNITY): Payer: Self-pay | Admitting: Pharmacist

## 2013-06-15 ENCOUNTER — Encounter (HOSPITAL_COMMUNITY): Payer: Self-pay | Admitting: *Deleted

## 2013-06-25 ENCOUNTER — Ambulatory Visit (HOSPITAL_COMMUNITY): Payer: BC Managed Care – PPO | Admitting: Certified Registered"

## 2013-06-25 ENCOUNTER — Encounter (HOSPITAL_COMMUNITY)
Admission: RE | Disposition: A | Discharge status: Home / Self Care | Payer: Self-pay | Source: Ambulatory Visit | Attending: Obstetrics & Gynecology

## 2013-06-25 ENCOUNTER — Encounter (HOSPITAL_COMMUNITY): Payer: Self-pay | Admitting: *Deleted

## 2013-06-25 ENCOUNTER — Encounter (HOSPITAL_COMMUNITY): Payer: Self-pay | Admitting: Certified Registered"

## 2013-06-25 ENCOUNTER — Ambulatory Visit (HOSPITAL_COMMUNITY)
Admission: RE | Admit: 2013-06-25 | Discharge: 2013-06-25 | Disposition: A | Discharge status: Home / Self Care | Payer: BC Managed Care – PPO | Source: Ambulatory Visit | Attending: Obstetrics & Gynecology | Admitting: Obstetrics & Gynecology

## 2013-06-25 DIAGNOSIS — M533 Sacrococcygeal disorders, not elsewhere classified: Secondary | ICD-10-CM

## 2013-06-25 DIAGNOSIS — F411 Generalized anxiety disorder: Secondary | ICD-10-CM

## 2013-06-25 DIAGNOSIS — F3289 Other specified depressive episodes: Secondary | ICD-10-CM

## 2013-06-25 DIAGNOSIS — N921 Excessive and frequent menstruation with irregular cycle: Secondary | ICD-10-CM

## 2013-06-25 DIAGNOSIS — F329 Major depressive disorder, single episode, unspecified: Secondary | ICD-10-CM

## 2013-06-25 DIAGNOSIS — Z87891 Personal history of nicotine dependence: Secondary | ICD-10-CM | POA: Insufficient documentation

## 2013-06-25 DIAGNOSIS — Z9851 Tubal ligation status: Secondary | ICD-10-CM | POA: Insufficient documentation

## 2013-06-25 HISTORY — DX: Low back pain, unspecified: M54.50

## 2013-06-25 HISTORY — DX: Missed abortion: O02.1

## 2013-06-25 HISTORY — DX: Low back pain: M54.5

## 2013-06-25 HISTORY — PX: DILITATION & CURRETTAGE/HYSTROSCOPY WITH HYDROTHERMAL ABLATION: SHX5570

## 2013-06-25 LAB — CBC
HCT: 41.4 % (ref 36.0–46.0)
Hemoglobin: 13.4 g/dL (ref 12.0–15.0)
MCHC: 32.4 g/dL (ref 30.0–36.0)
RDW: 13.2 % (ref 11.5–15.5)
WBC: 8.2 10*3/uL (ref 4.0–10.5)

## 2013-06-25 LAB — PREGNANCY, URINE: Preg Test, Ur: NEGATIVE

## 2013-06-25 SURGERY — DILATATION & CURETTAGE/HYSTEROSCOPY WITH HYDROTHERMAL ABLATION
Anesthesia: General | Site: Vagina | Wound class: Clean Contaminated

## 2013-06-25 MED ORDER — FENTANYL CITRATE 0.05 MG/ML IJ SOLN
INTRAMUSCULAR | Status: DC | PRN
  Administered 2013-06-25 (×2): 50 ug via INTRAVENOUS
  Administered 2013-06-25: 100 ug via INTRAVENOUS
  Administered 2013-06-25: 50 ug via INTRAVENOUS

## 2013-06-25 MED ORDER — ONDANSETRON HCL 4 MG/2ML IJ SOLN
INTRAMUSCULAR | Status: DC | PRN
  Administered 2013-06-25: 4 mg via INTRAVENOUS

## 2013-06-25 MED ORDER — DEXAMETHASONE SODIUM PHOSPHATE 10 MG/ML IJ SOLN
INTRAMUSCULAR | Status: DC | PRN
  Administered 2013-06-25: 10 mg via INTRAVENOUS

## 2013-06-25 MED ORDER — LIDOCAINE HCL (CARDIAC) 20 MG/ML IV SOLN
INTRAVENOUS | Status: DC | PRN
  Administered 2013-06-25: 100 mg via INTRAVENOUS

## 2013-06-25 MED ORDER — PROPOFOL 10 MG/ML IV EMUL
INTRAVENOUS | Status: AC
  Filled 2013-06-25: qty 20

## 2013-06-25 MED ORDER — FENTANYL CITRATE 0.05 MG/ML IJ SOLN
INTRAMUSCULAR | Status: AC
  Filled 2013-06-25: qty 5

## 2013-06-25 MED ORDER — LIDOCAINE HCL (CARDIAC) 20 MG/ML IV SOLN
INTRAVENOUS | Status: AC
  Filled 2013-06-25: qty 5

## 2013-06-25 MED ORDER — METOCLOPRAMIDE HCL 5 MG/ML IJ SOLN: 10.0000 mg | Freq: Once | INTRAMUSCULAR | Status: DC | PRN

## 2013-06-25 MED ORDER — LACTATED RINGERS IV SOLN: INTRAVENOUS | Status: DC

## 2013-06-25 MED ORDER — DEXAMETHASONE SODIUM PHOSPHATE 10 MG/ML IJ SOLN
INTRAMUSCULAR | Status: AC
  Filled 2013-06-25: qty 1

## 2013-06-25 MED ORDER — ONDANSETRON HCL 4 MG/2ML IJ SOLN
INTRAMUSCULAR | Status: AC
  Filled 2013-06-25: qty 2

## 2013-06-25 MED ORDER — LACTATED RINGERS IV SOLN
INTRAVENOUS | Status: DC
  Administered 2013-06-25: 14:00:00 via INTRAVENOUS
  Administered 2013-06-25: 100 mL/h via INTRAVENOUS

## 2013-06-25 MED ORDER — MEPERIDINE HCL 25 MG/ML IJ SOLN: 6.2500 mg | INTRAMUSCULAR | Status: DC | PRN

## 2013-06-25 MED ORDER — MIDAZOLAM HCL 5 MG/5ML IJ SOLN
INTRAMUSCULAR | Status: DC | PRN
  Administered 2013-06-25: 2 mg via INTRAVENOUS

## 2013-06-25 MED ORDER — MIDAZOLAM HCL 2 MG/2ML IJ SOLN
INTRAMUSCULAR | Status: AC
  Filled 2013-06-25: qty 2

## 2013-06-25 MED ORDER — PROPOFOL 10 MG/ML IV BOLUS
INTRAVENOUS | Status: DC | PRN
  Administered 2013-06-25: 150 mg via INTRAVENOUS

## 2013-06-25 MED ORDER — FENTANYL CITRATE 0.05 MG/ML IJ SOLN: 25.0000 ug | INTRAMUSCULAR | Status: DC | PRN

## 2013-06-25 MED ORDER — SODIUM CHLORIDE 0.9 % IR SOLN
Status: DC | PRN
  Administered 2013-06-25: 3000 mL

## 2013-06-25 SURGICAL SUPPLY — 20 items
CATH ROBINSON RED A/P 16FR (CATHETERS) ×2 IMPLANT
CLOTH BEACON ORANGE TIMEOUT ST (SAFETY) ×2 IMPLANT
CONTAINER PREFILL 10% NBF 60ML (FORM) ×4 IMPLANT
COVER MAYO STAND STRL (DRAPES) ×2 IMPLANT
DRAPE HYSTEROSCOPY (DRAPE) ×2 IMPLANT
DRESSING TELFA 8X3 (GAUZE/BANDAGES/DRESSINGS) ×2 IMPLANT
ELECT REM PT RETURN 9FT ADLT (ELECTROSURGICAL)
ELECTRODE REM PT RTRN 9FT ADLT (ELECTROSURGICAL) IMPLANT
GLOVE BIO SURGEON STRL SZ7 (GLOVE) ×2 IMPLANT
GLOVE BIOGEL PI IND STRL 7.0 (GLOVE) ×1 IMPLANT
GLOVE BIOGEL PI INDICATOR 7.0 (GLOVE) ×1
GOWN STRL REIN XL XLG (GOWN DISPOSABLE) ×4 IMPLANT
NEEDLE SPNL 22GX3.5 QUINCKE BK (NEEDLE) ×2 IMPLANT
NS IRRIG 1000ML POUR BTL (IV SOLUTION) ×2 IMPLANT
PACK VAGINAL MINOR WOMEN LF (CUSTOM PROCEDURE TRAY) ×2 IMPLANT
PAD OB MATERNITY 4.3X12.25 (PERSONAL CARE ITEMS) ×2 IMPLANT
PENCIL BUTTON HOLSTER BLD 10FT (ELECTRODE) ×2 IMPLANT
SET GENESYS HTA PROCERVA (MISCELLANEOUS) ×2 IMPLANT
SYR CONTROL 10ML LL (SYRINGE) ×2 IMPLANT
TOWEL OR 17X24 6PK STRL BLUE (TOWEL DISPOSABLE) ×4 IMPLANT

## 2013-06-25 NOTE — Anesthesia Postprocedure Evaluation (Signed)
Anesthesia Post Note  Patient: Miranda Castro  Procedure(s) Performed: Procedure(s) (LRB): DILATATION & CURETTAGE/HYSTEROSCOPY WITH HYDROTHERMAL ABLATION (N/A)  Anesthesia type: General  Patient location: PACU  Post pain: Pain level controlled  Post assessment: Post-op Vital signs reviewed  Last Vitals:  Filed Vitals:   06/25/13 1515  BP: 120/80  Pulse: 73  Temp:   Resp: 12    Post vital signs: Reviewed  Level of consciousness: sedated  Complications: No apparent anesthesia complications

## 2013-06-25 NOTE — H&P (Signed)
Miranda Castro is an 52 y.o. female with menometrrohagia.  Pt has a questionable polyp vs fibroid (1 cm) at the fundus.  Pt has negative endometrial biopsy.  Pt here for management of abnormal uterine bleeding and evaluation of possible polyp.  Pertinent Gynecological History:  Bleeding: intermenstrual bleeding, heavy bleeding Contraception: BTL DES exposure: denies Sexually transmitted diseases: no past history Previous GYN Procedures: none  Last mammogram: normal Date: 2014 Last pap: normal Date: 2014 OB History: G5, P3023   Menstrual History:  Received one dose of Lupron--starting to bleedin today.   Past Medical History  Diagnosis Date  . Lower back pain     DSI joint  . SVD (spontaneous vaginal delivery)     x 3  . Missed abortion     x 2 - no surgery required    Past Surgical History  Procedure Laterality Date  . Tubal ligation  02/19/1997  . Dilation and curettage of uterus  12/06/82  . Wisdom tooth extraction      History reviewed. No pertinent family history.  Social History:  reports that she quit smoking about 14 years ago. Her smoking use included Cigarettes. She smoked 1.00 pack per day. She has never used smokeless tobacco. She reports that she drinks about 0.5 ounces of alcohol per week. She reports that she does not use illicit drugs.  Allergies: No Known Allergies  Prescriptions prior to admission  Medication Sig Dispense Refill  . meloxicam (MOBIC) 15 MG tablet Take 15 mg by mouth daily as needed for pain.        Review of Systems  Constitutional: Negative.   Respiratory: Negative.   Cardiovascular: Negative.   Gastrointestinal: Negative.   Musculoskeletal: Negative.   Neurological: Negative.   Psychiatric/Behavioral: Negative.     Height 5\' 3"  (1.6 m), weight 165 lb (74.844 kg). Physical Exam  Vitals reviewed. Constitutional: She is oriented to person, place, and time. She appears well-developed and well-nourished. No distress.  HENT:   Head: Normocephalic and atraumatic.  Eyes: Conjunctivae are normal.  Neck: Neck supple. No thyromegaly present.  Cardiovascular: Normal rate and regular rhythm.   Respiratory: Effort normal and breath sounds normal.  GI: Soft. She exhibits no distension. There is no rebound.  Genitourinary:  Will rpt bimanual in the OR  Musculoskeletal: She exhibits no edema.  Neurological: She is alert and oriented to person, place, and time.  Skin: Skin is warm and dry.  Psychiatric: She has a normal mood and affect.    No results found for this or any previous visit (from the past 24 hour(s)).  No results found.  Assessment/Plan: 52 yo female with menometrrohagia and questionable 1 cm polyp vs fibroid at fundus.  Pt for D&C, hysteroscopy and hydrothermal ablation.   Pt consented for D & C, hysteroscopy, polypectomy, and hydrothermal endometrial ablation.  Risks include but not limited to bleeding, infection, damage to uterus, burn in the vagina.  SCDs placed for DVT prophylaxis.  All questions answered.   Theophil Thivierge H. 06/25/2013, 12:18 PM

## 2013-06-25 NOTE — Transfer of Care (Signed)
Immediate Anesthesia Transfer of Care Note  Patient: Miranda Castro  Procedure(s) Performed: Procedure(s): DILATATION & CURETTAGE/HYSTEROSCOPY WITH HYDROTHERMAL ABLATION (N/A)  Patient Location: PACU  Anesthesia Type:General  Level of Consciousness: awake, alert  and oriented  Airway & Oxygen Therapy: Patient Spontanous Breathing and Patient connected to nasal cannula oxygen  Post-op Assessment: Report given to PACU RN and Post -op Vital signs reviewed and stable  Post vital signs: Reviewed and stable  Complications: No apparent anesthesia complications

## 2013-06-25 NOTE — Op Note (Signed)
PREOPERATIVE DIAGNOSIS:  52 yo female with menometrorrhagia   POSTOPERATIVE DIAGNOSIS: 52 yo female with menometrorrhagia and cervical mass  PROCEDURE:  D & C, Hysteroscopy, Hydrothermal Endometrial Ablation, removal of cervical mass SURGEON:  Dr. Elsie Lincoln  INDICATIONS: 52 y.o. yo [phone]  here for menometrorrhagia and 1 cm lesion at fundus on [place].  Risks of surgery were discussed with the patient including but not limited to: bleeding which may require transfusion; infection which may require antibiotics; injury to uterus leading to risk of injury to surrounding intraperitoneal organs, need for additional procedures including laparoscopy or laparotomy, and other postoperative/anesthesia complications. Written informed consent was obtained.   FINDINGS:  A nml size retroverted uterus.  Diffuse proliferative endometrium.  Normal ostia bilaterally.  No obvious polyp.  ANESTHESIA:   General  ESTIMATED BLOOD LOSS:  Less than 20 ml.  SPECIMENS: EMC and cervical mass  COMPLICATIONS:  None immediate.  PROCEDURE DETAILS:  The patient was taken to the operating room where general anesthesia was administered and was found to be adequate.  After an adequate timeout was performed, she was placed in the dorsal lithotomy position and examined; then prepped and draped in the sterile manner.   Her bladder was catheterized for an unmeasured amount of clear, yellow urine. A speculum was then placed in the patient's vagina and a single tooth tenaculum was applied to the anterior lip of the cervix.  The cervix was dilated manually with half-sized Hegar dilators to accommodate the 8 mm hysteroscope.  Once the cervix was dilated, the hysteroscope was inserted under direct visualization. The uterine cavity was carefully examined, both ostia were recognized, and diffusely proliferative endometrium with polypoid fragments was noted.  A cervical seal test was conducted and there was no fluid loss.  The Hydrothermal  ablation was conducted with a 10 minute ablation at 80 degrees centigrade.  There was a 1 minute 30 second cool down period.  The hysteroscope was removed and a gentle curretage was performed. The tenaculum was removed from the anterior lip of the cervix, and the vaginal speculum was removed after noting good hemostasis.  The 1.5 cm cervical mass on the left anterior lip was removed.  Hemostasis was achieved with the Bovie.  The patient tolerated the procedure well and was taken to the recovery area awake, extubated and in stable condition.  The patient will be discharged to home as per PACU criteria.  Routine postoperative instructions given.  She will resume Mobic for pain.  She will follow up in my office in 2-3 weeks for postoperative evaluation .

## 2013-06-25 NOTE — Anesthesia Preprocedure Evaluation (Signed)
Anesthesia Evaluation  Patient identified by MRN, date of birth, ID band Patient awake    Reviewed: Allergy & Precautions, H&P , NPO status , Patient's Chart, lab work & pertinent test results  Airway Mallampati: II TM Distance: >3 FB Neck ROM: Full    Dental no notable dental hx. (+) Teeth Intact   Pulmonary neg pulmonary ROS, Current Smoker,  breath sounds clear to auscultation  Pulmonary exam normal       Cardiovascular negative cardio ROS  Rhythm:Regular Rate:Normal     Neuro/Psych PSYCHIATRIC DISORDERS Anxiety Depression    GI/Hepatic negative GI ROS, Neg liver ROS,   Endo/Other  negative endocrine ROS  Renal/GU negative Renal ROS  negative genitourinary   Musculoskeletal   Abdominal   Peds  Hematology negative hematology ROS (+)   Anesthesia Other Findings   Reproductive/Obstetrics DUB Endometrial Polyp                           Anesthesia Physical Anesthesia Plan  ASA: II  Anesthesia Plan: General   Post-op Pain Management:    Induction: Intravenous  Airway Management Planned: LMA  Additional Equipment:   Intra-op Plan:   Post-operative Plan:   Informed Consent: I have reviewed the patients History and Physical, chart, labs and discussed the procedure including the risks, benefits and alternatives for the proposed anesthesia with the patient or authorized representative who has indicated his/her understanding and acceptance.   Dental advisory given  Plan Discussed with: Anesthesiologist, CRNA and Surgeon  Anesthesia Plan Comments:         Anesthesia Quick Evaluation

## 2013-06-25 NOTE — Preoperative (Signed)
Beta Blockers   Reason not to administer Beta Blockers:Not Applicable 

## 2013-06-26 ENCOUNTER — Encounter (HOSPITAL_COMMUNITY): Payer: Self-pay | Admitting: Obstetrics & Gynecology

## 2013-06-27 ENCOUNTER — Telehealth: Payer: Self-pay | Admitting: *Deleted

## 2013-06-27 NOTE — Telephone Encounter (Signed)
Message copied by Granville Lewis on Wed Jun 27, 2013 11:02 AM ------      Message from: Lesly Dukes      Created: Wed Jun 27, 2013  8:42 AM       Pathology is benign.  RN to call pt with results.  Will see pt in 2 weeks for post op ------

## 2013-06-27 NOTE — Telephone Encounter (Signed)
Pt notified of benign pathology.  She is scheduled for her post op appt.

## 2013-07-19 ENCOUNTER — Ambulatory Visit (INDEPENDENT_AMBULATORY_CARE_PROVIDER_SITE_OTHER): Payer: BC Managed Care – PPO | Admitting: Obstetrics & Gynecology

## 2013-07-19 ENCOUNTER — Encounter: Payer: Self-pay | Admitting: Obstetrics & Gynecology

## 2013-07-19 VITALS — BP 116/89 | HR 68 | Resp 16 | Ht 62.0 in | Wt 167.0 lb

## 2013-07-19 DIAGNOSIS — N898 Other specified noninflammatory disorders of vagina: Secondary | ICD-10-CM

## 2013-07-20 NOTE — Progress Notes (Signed)
  Subjective:    Patient ID: Miranda Castro, female    DOB: 09/08/1961, 52 y.o.   MRN: [id number]  HPI  Pt presents for f/u after endometrial ablation with HTA as well as remvoeal of cervical polyp.  Pt ahs had some yellowish discharge which has progressively lessened.  She is not experiencing any bleeding or pain.  She has not resumed sexual activity yet.  Pt is happy and satisfied with her procedure.  Review of Systems  As above.    Objective:   Physical Exam  Vitals reviewed. Constitutional: She is oriented to person, place, and time. She appears well-developed and well-nourished.  HENT:  Head: Atraumatic.  Eyes: Conjunctivae are normal.  Pulmonary/Chest: Effort normal.  Abdominal: Soft. She exhibits no distension and no mass. There is no tenderness. There is no rebound and no guarding.  Genitourinary: Vagina normal and uterus normal.  Cervix closes and and CMT; trace amounts of yellowish discharge.  Musculoskeletal: She exhibits no edema.  Neurological: She is alert and oriented to person, place, and time.  Skin: Skin is warm and dry.  Psychiatric: She has a normal mood and affect.          Assessment & Plan:  52 yo female s/p endometrial ablation and removal of cervical polyp Pathology benign No issues of complaints RTC in 1 year.

## 2013-10-11 ENCOUNTER — Other Ambulatory Visit: Payer: Self-pay

## 2014-10-07 ENCOUNTER — Encounter: Payer: Self-pay | Admitting: Obstetrics & Gynecology

## 2014-12-29 IMAGING — US US PELVIS COMPLETE
1 series · 13 of 25 positions shown · non-contrast
Comparison: None

CLINICAL DATA: Metromenorrhagia.  LMP is 01/19/2013

TRANSABDOMINAL AND TRANSVAGINAL ULTRASOUND OF PELVIS
TECHNIQUE: Both transabdominal and transvaginal ultrasound
examinations of the pelvis were performed. Transabdominal technique
was performed for global imaging of the pelvis including uterus,
ovaries, adnexal regions, and pelvic cul-de-sac.
It was necessary to proceed with endovaginal exam following the
transabdominal exam to visualize the uterus, endometrium, ovaries,
and adnexa.

[Series 1: us pelvis complete · 13 of 64 slices shown]
[im 1/64]
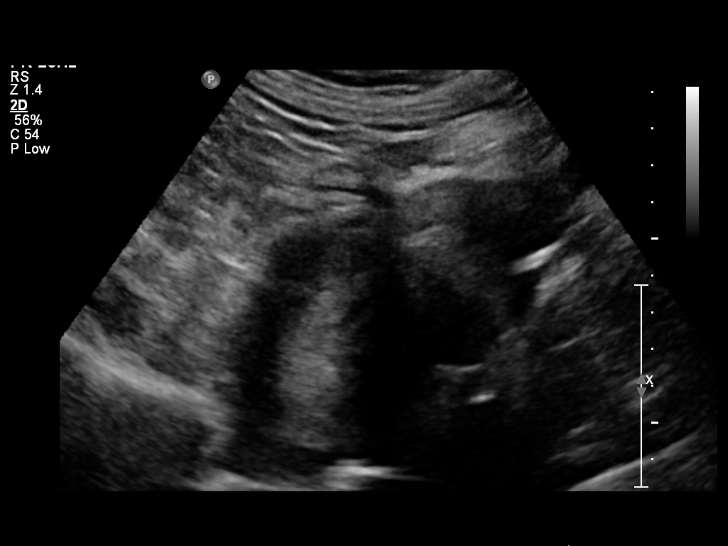
[im 6/64]
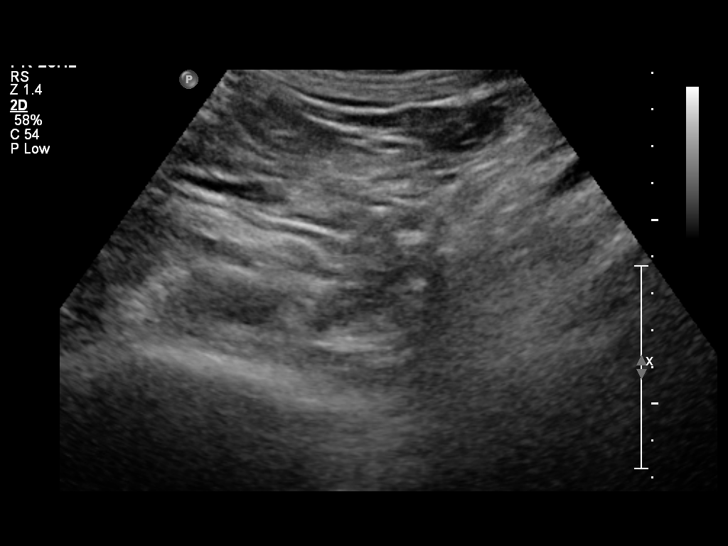
[im 11/64]
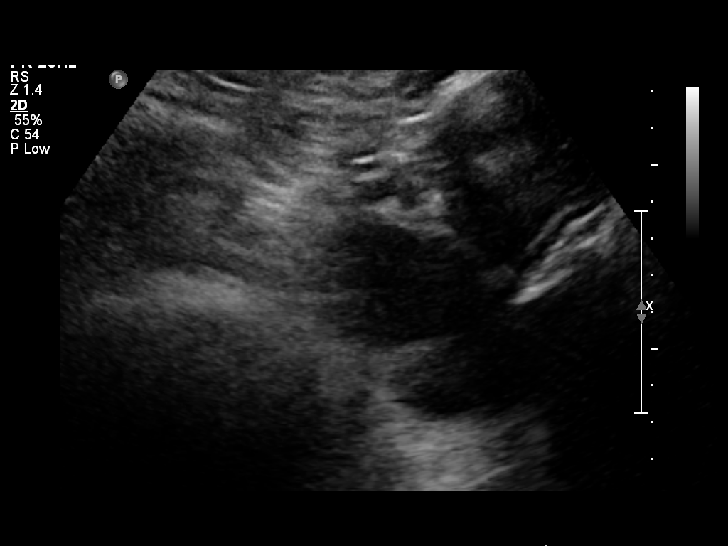
[im 16/64]
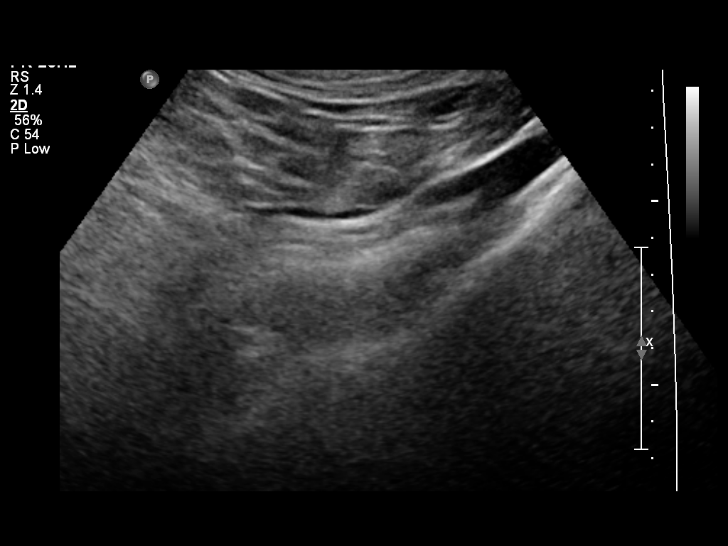
[im 22/64]
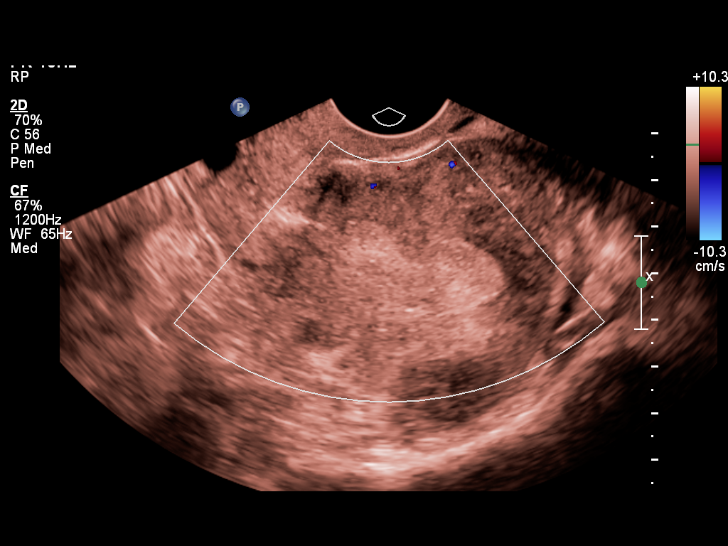
[im 27/64]
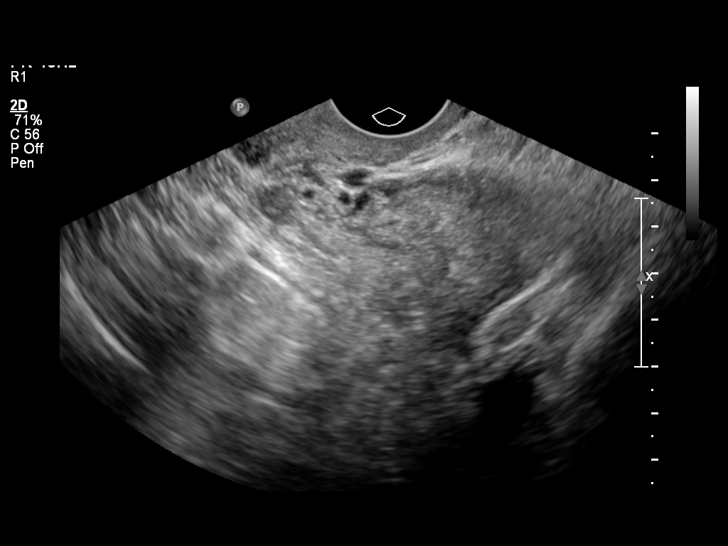
[im 32/64]
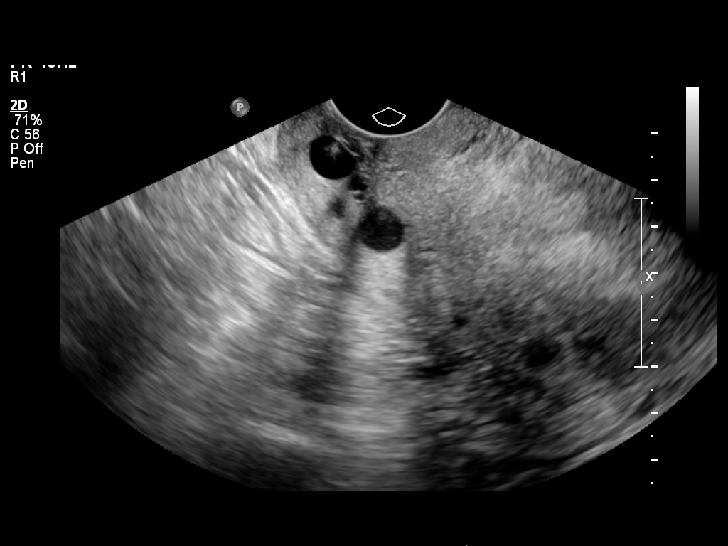
[im 37/64]
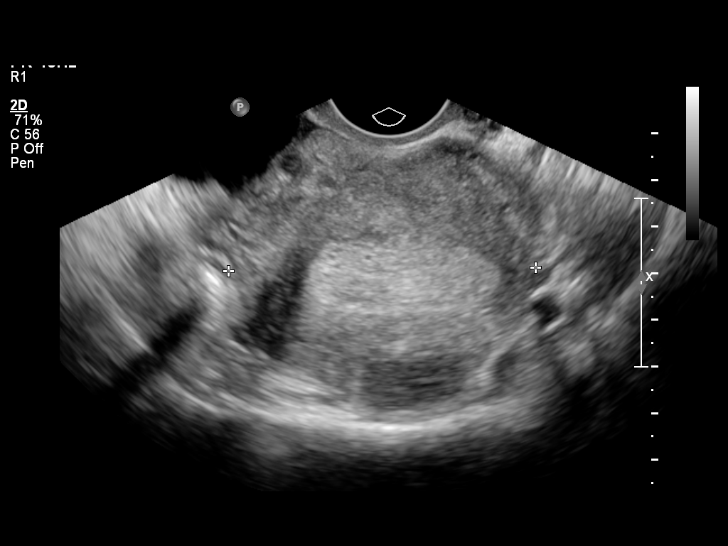
[im 43/64]
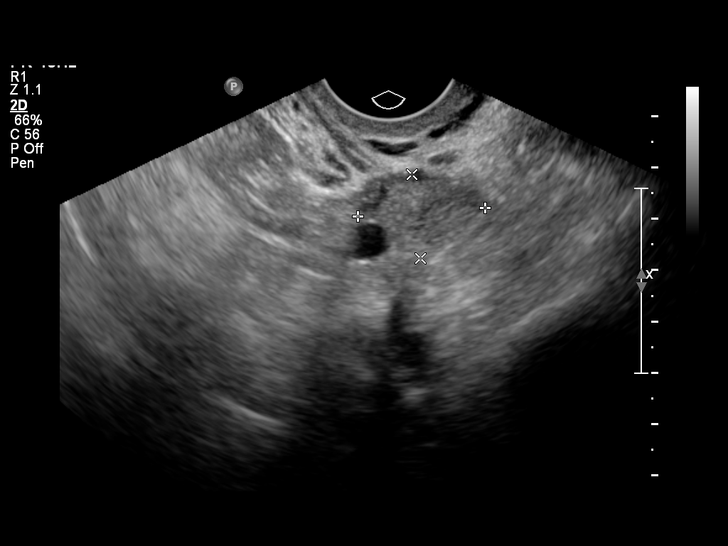
[im 48/64]
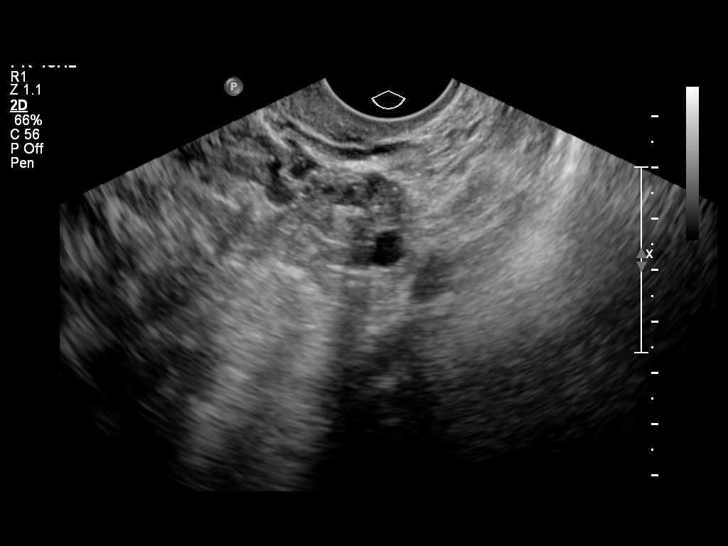
[im 53/64]
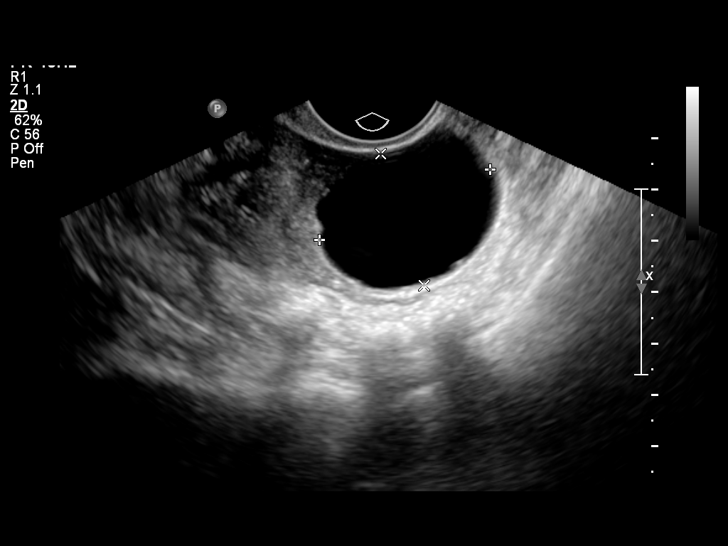
[im 58/64]
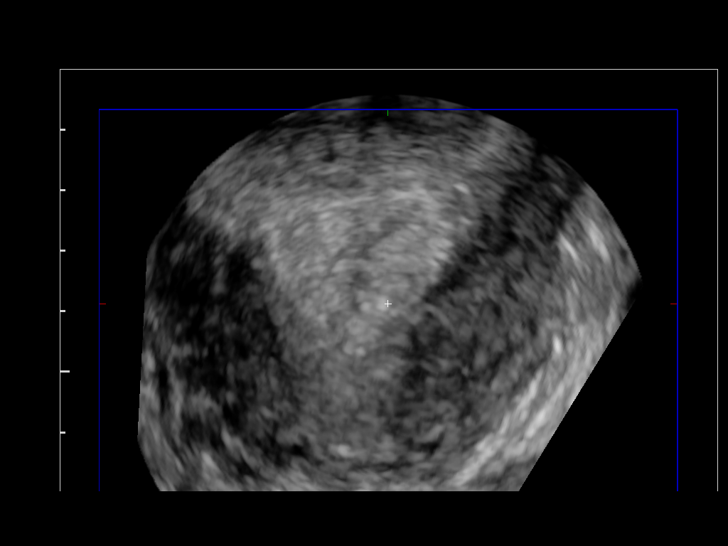
[im 64/64]
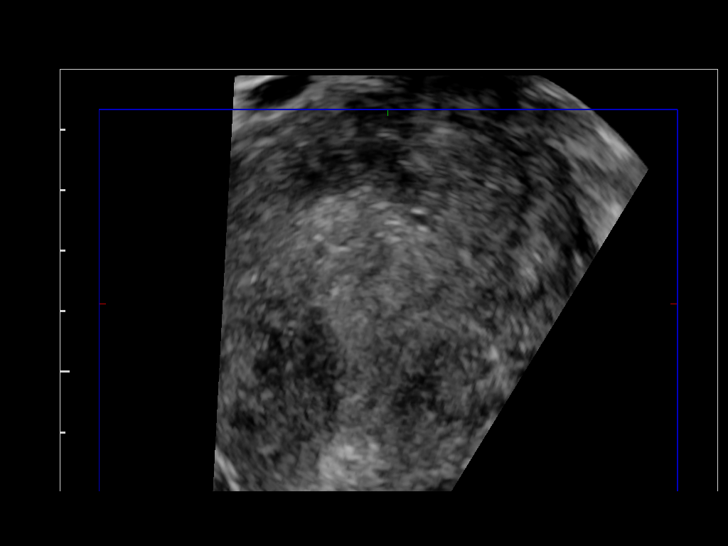

[13 of 25 positions shown; findings below may reference images not displayed]

FINDINGS: Uterus: The uterus is retroverted and measures 9.4 x 5.6 x 6.6 cm.
Multiple small intramural and subserosal fibroids are seen.  The
largest fibroid is in the anterior uterine body measuring 2.4 x
x 2.6 cm.  A total of five fibroids are seen.

Endometrium: The endometrium measures 1.6 cm maximal thickness.
This is the upper limits of normal for secretory phase endometrium
in a premenopausal patient.

Right ovary:  The right ovary measures 6.8 x 3.5 x 4 cm and
contains two simple cysts versus one large cyst with a single
internal septation.  The two cystic components measure 4 x 2.8 x
3.2 cm and 3.6 x 2.7 x 2.4 cm, respectively. There is some vascular
flow within the soft tissue between the two cystic components.

Left ovary: Measures 2.5 x 1.6 x 1.1 cm and has a normal
sonographic appearance.

Other findings: No free fluid
IMPRESSION: 1.  Endometrial thickness is upper normal for a premenopausal
patient, measuring 1.6 cm.
2.  Two simple cysts within the right ovary (or less likely one
large cyst with a single internal septation).  Pelvic ultrasound
follow-up after the patient's next menstrual cycle could be
considered to evaluate for resolution of these cysts.
3.  Multiple small uterine fibroids.  The largest fibroid measures
2.6 cm greatest diameter.

## 2015-04-18 ENCOUNTER — Ambulatory Visit: Payer: Self-pay | Admitting: Family Medicine

## 2016-06-29 ENCOUNTER — Ambulatory Visit (INDEPENDENT_AMBULATORY_CARE_PROVIDER_SITE_OTHER): Payer: No Typology Code available for payment source | Admitting: Family Medicine

## 2016-06-29 ENCOUNTER — Encounter: Payer: Self-pay | Admitting: Internal Medicine

## 2016-06-29 ENCOUNTER — Encounter: Payer: Self-pay | Admitting: Family Medicine

## 2016-06-29 VITALS — BP 121/67 | HR 71 | Ht 62.0 in | Wt 174.0 lb

## 2016-06-29 DIAGNOSIS — R10816 Epigastric abdominal tenderness: Secondary | ICD-10-CM

## 2016-06-29 DIAGNOSIS — R6881 Early satiety: Secondary | ICD-10-CM | POA: Diagnosis not present

## 2016-06-29 DIAGNOSIS — R1310 Dysphagia, unspecified: Secondary | ICD-10-CM

## 2016-06-29 DIAGNOSIS — K625 Hemorrhage of anus and rectum: Secondary | ICD-10-CM | POA: Diagnosis not present

## 2016-06-29 LAB — CBC WITH DIFFERENTIAL/PLATELET
Basophils Absolute: 0 {cells}/uL (ref 0–200)
Basophils Relative: 0 %
Eosinophils Absolute: 272 {cells}/uL (ref 15–500)
Eosinophils Relative: 4 %
HCT: 41.9 % (ref 35.0–45.0)
Hemoglobin: 13.9 g/dL (ref 11.7–15.5)
Lymphocytes Relative: 32 %
Lymphs Abs: 2176 {cells}/uL (ref 850–3900)
MCH: 31.4 pg (ref 27.0–33.0)
MCHC: 33.2 g/dL (ref 32.0–36.0)
MCV: 94.6 fL (ref 80.0–100.0)
MPV: 10.1 fL (ref 7.5–12.5)
Monocytes Absolute: 476 {cells}/uL (ref 200–950)
Monocytes Relative: 7 %
Neutro Abs: 3876 {cells}/uL (ref 1500–7800)
Neutrophils Relative %: 57 %
Platelets: 324 10*3/uL (ref 140–400)
RBC: 4.43 MIL/uL (ref 3.80–5.10)
RDW: 12.9 % (ref 11.0–15.0)
WBC: 6.8 10*3/uL (ref 3.8–10.8)

## 2016-06-29 LAB — COMPLETE METABOLIC PANEL WITH GFR
ALT: 6 U/L (ref 6–29)
AST: 10 U/L (ref 10–35)
Albumin: 4.3 g/dL (ref 3.6–5.1)
Alkaline Phosphatase: 71 U/L (ref 33–130)
BUN: 13 mg/dL (ref 7–25)
CHLORIDE: 105 mmol/L (ref 98–110)
CO2: 24 mmol/L (ref 20–31)
Calcium: 9.8 mg/dL (ref 8.6–10.4)
Creat: 0.71 mg/dL (ref 0.50–1.05)
Glucose, Bld: 85 mg/dL (ref 65–99)
Potassium: 4.4 mmol/L (ref 3.5–5.3)
Sodium: 140 mmol/L (ref 135–146)
Total Bilirubin: 0.9 mg/dL (ref 0.2–1.2)
Total Protein: 7 g/dL (ref 6.1–8.1)

## 2016-06-29 LAB — LIPID PANEL
Cholesterol: 195 mg/dL (ref 125–200)
HDL: 60 mg/dL (ref >=46)
LDL CALC: 103 mg/dL (ref <130)
TRIGLYCERIDES: 160 mg/dL — AB (ref <150)
Total CHOL/HDL Ratio: 3.3 Ratio (ref <=5.0)
VLDL: 32 mg/dL — AB (ref <30)

## 2016-06-29 LAB — IRON: Iron: 127 ug/dL (ref 45–160)

## 2016-06-29 LAB — POC HEMOCCULT BLD/STL (OFFICE/1-CARD/DIAGNOSTIC): Fecal Occult Blood, POC: NEGATIVE

## 2016-06-29 LAB — FERRITIN: FERRITIN: 62 ng/mL (ref 10–232)

## 2016-06-29 NOTE — Progress Notes (Signed)
Subjective:    CC: Rectal bleeding  HPI: Started 2.5 weeks ago.  She had rectal bleeding without a BM. Says gets full quickly when she eats.  + gassy and has been belching more.  Started drinking milk to help.  Quit taking Tylenol about 2 weeks ago and see if that helped. Hasn't had any more blood in the stool.  Last BM was today.  Didn't have insurance so didn't come in sooner.  Denies any active hemorrhoids. She had palms with them back when she had children but her children are now in college. She's not been taking any NSAIDs recently. She denies any significant nausea or vomiting. She has never had a screening colonoscopy and she is 5. She also reports some dysphagia. She says sometimes when she feels like her food gets to the area between her breasts just above her stomach she feels like sometimes it gets stuck. Particularly with meat. For example it happened last night after eating chicken. She says that started a couple months ago. It doesn't happen daily but maybe a couple times a week.  Past medical history, Surgical history, Family history not pertinant except as noted below, Social history, Allergies, and medications have been entered into the medical record, reviewed, and corrections made.   Review of Systems: No fevers, chills, night sweats, weight loss, chest pain, or shortness of breath.   Objective:    General: Well Developed, well nourished, and in no acute distress.  Neuro: Alert and oriented x3, extra-ocular muscles intact, sensation grossly intact.  HEENT: Normocephalic, atraumatic  Skin: Warm and dry, no rashes. Cardiac: Regular rate and rhythm, no murmurs rubs or gallops, no lower extremity edema.  Respiratory: Clear to auscultation bilaterally. Not using accessory muscles, speaking in full sentences. Abd: Bowel sounds normal. Soft. Mildly tender in the epigastric area. No rebound or guarding. No organomegaly. Rectal: Several external hemorrhoids but they are not swollen or  inflamed. No tears. Good rectal tone. With the anoscope I was able to see a hemorrhoid and varicose vein at the 1:00 position. No active bleeding or tears or fissures. Guaiac was negative.   Impression and Recommendations:   Rectal bleeding - She does have external hemorrhoids but they do not appear to be inflamed. She does have an internal hemorrhoid on exam. Certainly this could have been the source of bleeding. She is due for screening colonoscopy since she's over 50 so would like to get her scheduled for that as well. Avoid NSAIDs for now. Next  Early satiety. Will check a CMP and CBC. Consider a 14 day trial of PPI to see if this relieves her symptoms.  Dysphagia-recommend referral to GI for possible endoscopy along with getting her screening colonoscopy for which she is overdue.

## 2016-06-29 NOTE — Addendum Note (Signed)
Addended by: Teddy Spike on: 06/29/2016 10:46 AM   Modules accepted: Orders

## 2016-09-06 ENCOUNTER — Ambulatory Visit (INDEPENDENT_AMBULATORY_CARE_PROVIDER_SITE_OTHER): Payer: No Typology Code available for payment source | Admitting: Internal Medicine

## 2016-09-06 ENCOUNTER — Encounter: Payer: Self-pay | Admitting: Internal Medicine

## 2016-09-06 ENCOUNTER — Other Ambulatory Visit: Payer: Self-pay

## 2016-09-06 VITALS — BP 110/74 | HR 76 | Ht 63.0 in | Wt 176.2 lb

## 2016-09-06 DIAGNOSIS — K219 Gastro-esophageal reflux disease without esophagitis: Secondary | ICD-10-CM | POA: Diagnosis not present

## 2016-09-06 DIAGNOSIS — R1310 Dysphagia, unspecified: Secondary | ICD-10-CM | POA: Diagnosis not present

## 2016-09-06 DIAGNOSIS — K625 Hemorrhage of anus and rectum: Secondary | ICD-10-CM | POA: Diagnosis not present

## 2016-09-06 DIAGNOSIS — Z1211 Encounter for screening for malignant neoplasm of colon: Secondary | ICD-10-CM

## 2016-09-06 MED ORDER — NA SULFATE-K SULFATE-MG SULF 17.5-3.13-1.6 GM/177ML PO SOLN: 1.0000 | Freq: Once | ORAL | 0 refills | Status: AC

## 2016-09-06 NOTE — Progress Notes (Signed)
HISTORY OF PRESENT ILLNESS:  Miranda Castro is a 55 y.o. female who is referred by her primary care provider Dr. Madilyn Fireman with chief complaints of rectal bleeding, constipation, the need for colonoscopy, and dysphagia. The patient denies prior history of GI problems or GI evaluations. She states she was in her usual state of health until July when she developed problems with rectal bleeding. Described as bright red and not associated with rectal pain. Last approximate 7-10 days. Had some recurrent bleeding last week. She wonders about hemorrhoids. Rectal exam during her July 25 office visit revealed unimpressive external hemorrhoids. No other problems. Anoscopy did reveal internal hemorrhoid. Stool was guaiac-negative. Blood work unremarkable including comprehensive metabolic panel, CBC, and iron studies. Hemoglobin 13.9. Patient denies family history of colon cancer. Her bowels have tended to be more constipated in recent months. She has had weight gain. Next, she reports long-standing problems with heartburn indigestion for which she takes Prilosec OTC approximate 4 days per week. Significant symptoms off medication. Does not take regularly. Has at least a one-year history of intermittent solid food dysphagia item such as meat. GI review of systems otherwise negative.  REVIEW OF SYSTEMS:  All non-GI ROS negative except for back pain and night sweats  Past Medical History:  Diagnosis Date  . Depression   . Lower back pain    DSI joint  . Missed abortion    x 2 - no surgery required  . SVD (spontaneous vaginal delivery)    x 3  . Unexplained night sweats     Past Surgical History:  Procedure Laterality Date  . DILATION AND CURETTAGE OF UTERUS  12/06/82  . DILITATION & CURRETTAGE/HYSTROSCOPY WITH HYDROTHERMAL ABLATION N/A 06/25/2013   Procedure: DILATATION & CURETTAGE/HYSTEROSCOPY WITH HYDROTHERMAL ABLATION;  Surgeon: Guss Bunde, MD;  Location: [facility];  Service: Gynecology;   Laterality: N/A;  . TUBAL LIGATION  02/19/1997  . WISDOM TOOTH EXTRACTION      Social History [name]  reports that she quit smoking about 17 years ago. Her smoking use included Cigarettes. She smoked 1.00 pack per day. She has never used smokeless tobacco. She reports that she drinks about 0.5 oz of alcohol per week . She reports that she does not use drugs.  family history includes Breast cancer in her mother; Skin cancer in her father.  No Known Allergies     PHYSICAL EXAMINATION: Vital signs: BP 110/74 (BP Location: Left Arm, Patient Position: Sitting, Cuff Size: Normal)   Pulse 76   Ht 5\' 3"  (1.6 m)   Wt 176 lb 4 oz (79.9 kg)   LMP 04/25/2013   BMI 31.22 kg/m   Constitutional: generally well-appearing, no acute distress Psychiatric: alert and oriented x3, cooperative Eyes: extraocular movements intact, anicteric, conjunctiva pink Mouth: oral pharynx moist, no lesions Neck: suppleWithout family Lymph: no supraclavicular lymphadenopathy Cardiovascular: heart regular rate and rhythm, no murmur Lungs: clear to auscultation bilaterally Abdomen: soft, obese, nontender, nondistended, no obvious ascites, no peritoneal signs, normal bowel sounds, no organomegaly Rectal:Per Dr. Madilyn Fireman in July. Deferred until upcoming colonoscopy Extremities: no clubbing cyanosis or lower extremity edema bilaterally Skin: no lesions on visible extremities Neuro: No focal deficits. Normal DTRs. Cranial nerves intact  ASSESSMENT:  #1. Rectal bleeding. Rule out benign anorectal pathology. Rule out neoplasia #2. Constipation, new onset #3. Chronic GERD. Incompletely treated #4. Intermittent solid food dysphagia likely secondary to peptic stricture #5. Colon cancer screening. Due  PLAN:  #1. Reflux precautions. Reviewed #2. Take PPI daily.  Prilosec OTC 20 mg daily #3. Daily fiber supplementation and increased water intake, for constipation #4. Schedule upper endoscopy with probable  esophageal dilation.The nature of the procedure, as well as the risks, benefits, and alternatives were carefully and thoroughly reviewed with the patient. Ample time for discussion and questions allowed. The patient understood, was satisfied, and agreed to proceed. #5. Schedule colonoscopy for colon cancer screening and to evaluate rectal bleeding and new onset constipation.The nature of the procedure, as well as the risks, benefits, and alternatives were carefully and thoroughly reviewed with the patient. Ample time for discussion and questions allowed. The patient understood, was satisfied, and agreed to proceed.  This consultation note has been sent to Dr. Madilyn Fireman

## 2016-09-06 NOTE — Patient Instructions (Signed)
If you are age 56 or older, your body mass index should be between 23-30. Your Body mass index is 31.22 kg/m. If this is out of the aforementioned range listed, please consider follow up with your Primary Care Provider.  If you are age 26 or younger, your body mass index should be between 19-25. Your Body mass index is 31.22 kg/m. If this is out of the aformentioned range listed, please consider follow up with your Primary Care Provider.   Please start taking your Prilosec everyday.  You have been scheduled for an endoscopy and colonoscopy. Please follow the written instructions given to you at your visit today. Please pick up your prep supplies at the pharmacy within the next 1-3 days. If you use inhalers (even only as needed), please bring them with you on the day of your procedure. Your physician has requested that you go to www.startemmi.com and enter the access code given to you at your visit today. This web site gives a general overview about your procedure. However, you should still follow specific instructions given to you by our office regarding your preparation for the procedure.  Thank you for choosing [facility]  Dr Scarlette Shorts

## 2016-09-09 ENCOUNTER — Encounter: Payer: Self-pay | Admitting: Internal Medicine

## 2016-09-16 ENCOUNTER — Telehealth: Payer: Self-pay | Admitting: *Deleted

## 2016-09-16 ENCOUNTER — Encounter: Payer: Self-pay | Admitting: Internal Medicine

## 2016-09-16 ENCOUNTER — Ambulatory Visit (INDEPENDENT_AMBULATORY_CARE_PROVIDER_SITE_OTHER): Payer: No Typology Code available for payment source | Admitting: Internal Medicine

## 2016-09-16 VITALS — BP 107/77 | HR 70 | Temp 97.8°F | Resp 16 | Ht 63.0 in | Wt 176.0 lb

## 2016-09-16 DIAGNOSIS — Z1211 Encounter for screening for malignant neoplasm of colon: Secondary | ICD-10-CM

## 2016-09-16 DIAGNOSIS — D122 Benign neoplasm of ascending colon: Secondary | ICD-10-CM | POA: Diagnosis not present

## 2016-09-16 DIAGNOSIS — R1310 Dysphagia, unspecified: Secondary | ICD-10-CM | POA: Diagnosis not present

## 2016-09-16 DIAGNOSIS — Z1212 Encounter for screening for malignant neoplasm of rectum: Secondary | ICD-10-CM

## 2016-09-16 DIAGNOSIS — D124 Benign neoplasm of descending colon: Secondary | ICD-10-CM

## 2016-09-16 DIAGNOSIS — K635 Polyp of colon: Secondary | ICD-10-CM | POA: Diagnosis not present

## 2016-09-16 DIAGNOSIS — K219 Gastro-esophageal reflux disease without esophagitis: Secondary | ICD-10-CM

## 2016-09-16 MED ORDER — OMEPRAZOLE 40 MG PO CPDR: 40.0000 mg | DELAYED_RELEASE_CAPSULE | Freq: Every day | ORAL | 11 refills | Status: DC

## 2016-09-16 MED ORDER — SODIUM CHLORIDE 0.9 % IV SOLN: 500.0000 mL | INTRAVENOUS | Status: AC

## 2016-09-16 NOTE — Progress Notes (Signed)
To PACU, vss patent aw report to rn 

## 2016-09-16 NOTE — Patient Instructions (Signed)
.YOU HAD AN ENDOSCOPIC PROCEDURE TODAY AT [facility]:   Refer to the procedure report that was given to you for any specific questions about what was found during the examination.  If the procedure report does not answer your questions, please call your gastroenterologist to clarify.  If you requested that your care partner not be given the details of your procedure findings, then the procedure report has been included in a sealed envelope for you to review at your convenience later.  YOU SHOULD EXPECT: Some feelings of bloating in the abdomen. Passage of more gas than usual.  Walking can help get rid of the air that was put into your GI tract during the procedure and reduce the bloating. If you had a lower endoscopy (such as a colonoscopy or flexible sigmoidoscopy) you may notice spotting of blood in your stool or on the toilet paper. If you underwent a bowel prep for your procedure, you may not have a normal bowel movement for a few days.  Please Note:  You might notice some irritation and congestion in your nose or some drainage.  This is from the oxygen used during your procedure.  There is no need for concern and it should clear up in a day or so.  SYMPTOMS TO REPORT IMMEDIATELY:   Following lower endoscopy (colonoscopy or flexible sigmoidoscopy):  Excessive amounts of blood in the stool  Significant tenderness or worsening of abdominal pains  Swelling of the abdomen that is new, acute  Fever of 100F or higher   Following upper endoscopy (EGD)  Vomiting of blood or coffee ground material  New chest pain or pain under the shoulder blades  Painful or persistently difficult swallowing  New shortness of breath  Fever of 100F or higher  Black, tarry-looking stools  For urgent or emergent issues, a gastroenterologist can be reached at any hour by calling [phone].   DIET:  We do recommend a small meal at first, but then you may proceed to your regular diet.  Drink  plenty of fluids but you should avoid alcoholic beverages for 24 hours.  ACTIVITY:  You should plan to take it easy for the rest of today and you should NOT DRIVE or use heavy machinery until tomorrow (because of the sedation medicines used during the test).    FOLLOW UP: Our staff will call the number listed on your records the next business day following your procedure to check on you and address any questions or concerns that you may have regarding the information given to you following your procedure. If we do not reach you, we will leave a message.  However, if you are feeling well and you are not experiencing any problems, there is no need to return our call.  We will assume that you have returned to your regular daily activities without incident.  If any biopsies were taken you will be contacted by phone or by letter within the next 1-3 weeks.  Please call us at [phone] if you have not heard about the biopsies in 3 weeks.    SIGNATURES/CONFIDENTIALITY: You and/or your care partner have signed paperwork which will be entered into your electronic medical record.  These signatures attest to the fact that that the information above on your After Visit Summary has been reviewed and is understood.  Full responsibility of the confidentiality of this discharge information lies with you and/or your care-partner.  Esophagitis, GERD, polyps-handouts given  Omeprazole 40 mg daily .  Office follow-up in 8 weeks, office will call with this appointment.  Repeat colonoscopy will be determined by pathology.

## 2016-09-16 NOTE — Op Note (Signed)
[place] Patient Name: Miranda Castro Procedure Date: 09/16/2016 2:43 PM MRN: [id number] Endoscopist: Docia Chuck. Henrene Pastor , MD Age: 55 Referring MD:  Date of Birth: 03-05-1961 Gender: Female Account #: [id number] Procedure:                Upper GI endoscopy Indications:              Dysphagia, Heartburn Medicines:                Monitored Anesthesia Care Procedure:                Pre-Anesthesia Assessment:                           - Prior to the procedure, a History and Physical                            was performed, and patient medications and                            allergies were reviewed. The patient's tolerance of                            previous anesthesia was also reviewed. The risks                            and benefits of the procedure and the sedation                            options and risks were discussed with the patient.                            All questions were answered, and informed consent                            was obtained. Prior Anticoagulants: The patient has                            taken no previous anticoagulant or antiplatelet                            agents. ASA Grade Assessment: II - A patient with                            mild systemic disease. After reviewing the risks                            and benefits, the patient was deemed in                            satisfactory condition to undergo the procedure.                           After obtaining informed consent, the endoscope was  passed under direct vision. Throughout the                            procedure, the patient's blood pressure, pulse, and                            oxygen saturations were monitored continuously. The                            Model GIF-HQ190 [id number]) scope was introduced                            through the mouth, and advanced to the second part                            of duodenum. The upper GI  endoscopy was                            accomplished without difficulty. The patient                            tolerated the procedure well. Scope In: Scope Out: Findings:                 LA Grade A (one or more mucosal breaks less than 5                            mm, not extending between tops of 2 mucosal folds)                            esophagitis was found. There is significant edema.                           One mild benign-appearing, intrinsic stenosis was                            found. This measured approximately 15 mm and was                            traversed.                           A medium-sized hiatal hernia was present. There                            appeared to be a small esophageal component.                           Multiple erosions were found in the gastric antrum.                            Biopsies were taken with a cold forceps for  Helicobacter pylori testing using CLOtest.                           The exam of the stomach was otherwise normal.                           The examined duodenum was normal. Complications:            No immediate complications. Estimated Blood Loss:     Estimated blood loss: none. Impression:               - GERD with esophagitis stricture. No dilation due                            to degree of underlying esophagitis.                           - Medium-sized hiatal hernia, with small                            paraesophageal component.                           - Erosive gastropathy. Biopsied for H. pylori                           - Normal exam otherwise. Recommendation:           - Strict adherence to reflux precautions with                            attention to weight loss.                           - Prescribe omeprazole 40 mg daily; #30; 11 refills.                           - Colonoscopy today (please see report)                           - Please schedule office follow-up with Dr.  Henrene Pastor                            in approximately 8 weeks. Docia Chuck. Henrene Pastor, MD 09/16/2016 3:36:40 PM This report has been signed electronically.

## 2016-09-16 NOTE — Progress Notes (Signed)
Called to room to assist during endoscopic procedure.  Patient ID and intended procedure confirmed with present staff. Received instructions for my participation in the procedure from the performing physician.  

## 2016-09-16 NOTE — Op Note (Signed)
[place] Patient Name: Miranda Castro Procedure Date: 09/16/2016 2:47 PM MRN: [id number] Endoscopist: Docia Chuck. Henrene Pastor , MD Age: 55 Referring MD:  Date of Birth: 30-Nov-1961 Gender: Female Account #: [id number] Procedure:                Colonoscopyall with cold snare polypectomy X2 Indications:              Screening for colorectal malignant neoplasm Medicines:                Monitored Anesthesia Care Procedure:                Pre-Anesthesia Assessment:                           - Prior to the procedure, a History and Physical                            was performed, and patient medications and                            allergies were reviewed. The patient's tolerance of                            previous anesthesia was also reviewed. The risks                            and benefits of the procedure and the sedation                            options and risks were discussed with the patient.                            All questions were answered, and informed consent                            was obtained. Prior Anticoagulants: The patient has                            taken no previous anticoagulant or antiplatelet                            agents. ASA Grade Assessment: II - A patient with                            mild systemic disease. After reviewing the risks                            and benefits, the patient was deemed in                            satisfactory condition to undergo the procedure.                           After obtaining informed consent, the colonoscope  was passed under direct vision. Throughout the                            procedure, the patient's blood pressure, pulse, and                            oxygen saturations were monitored continuously. The                            Model PCF-[id number]) scope was introduced                            through the anus and advanced to the the cecum,                         identified by appendiceal orifice and ileocecal                            valve. The ileocecal valve, appendiceal orifice,                            and rectum were photographed. The quality of the                            bowel preparation was excellent. The colonoscopy                            was performed without difficulty. The patient                            tolerated the procedure well. The bowel preparation                            used was SUPREP. Scope In: 3:09:28 PM Scope Out: 3:25:54 PM Scope Withdrawal Time: 0 hours 12 minutes 4 seconds  Total Procedure Duration: 0 hours 16 minutes 26 seconds  Findings:                 Two polyps were found in the descending colon and                            ascending colon. The polyps were 3 mm in size.                            These polyps were removed with a cold snare.                            Resection and retrieval were complete.                           No other significant abnormalities were identified                            in a careful examination of the remainder of the  colon.                           No additional abnormalities were found on                            retroflexion. Complications:            No immediate complications. Estimated blood loss:                            None. Estimated Blood Loss:     Estimated blood loss: none. Impression:               - Two 3 mm polyps in the descending colon and in                            the ascending colon, removed with a cold snare.                            Resected and retrieved.                           - Otherwise normal exam                           - Internal and external hemorrhoids Recommendation:           - Repeat colonoscopy in 5-10 years for                            surveillance, based on pathology.                           - Await pathology results. Docia Chuck. Henrene Pastor, MD 09/16/2016  3:46:22 PM This report has been signed electronically.

## 2016-09-16 NOTE — Telephone Encounter (Signed)
Addendum to procedure 09/16/16.   When pt sat up to get dressed, pt started coughing, not uncommon after EGD but pt stated that her chest hurt when she coughed and when she would take a breath after coughing, pt had several episodes of coughing and every time she would cough she would say it hurt and then it hurt to take a breath after, went and spoke with Dr Henrene Pastor and Nicki Reaper CRNA, there were no episodes in procedure, states pt stomach was empty, no aspiration, not sure why pt would be coughing and having discomfort, gave pt some ice to see if it would settle coughing, Scott CRNA came in and spoke with pt, assessed lung sounds, report lungs sounds clear, more bronchial sounding, pt keep stating she will be ok, pts seems to be coughing less at this point, pt states she wants to go home, she feels this will subside, discharge instructions were given previously, and pt verbalized s/s of complications, pt was given a peppermint to see if that would sooth her throat for her ride home, pt states she will call number on discharge instructions if anything changes, pt discharged home-adm

## 2016-09-17 ENCOUNTER — Telehealth: Payer: Self-pay

## 2016-09-17 LAB — HELICOBACTER PYLORI SCREEN-BIOPSY: UREASE: NEGATIVE

## 2016-09-17 NOTE — Telephone Encounter (Signed)
  Follow up Call-  Call back number 09/16/2016  Post procedure Call Back phone  # [phone]  Permission to leave phone message Yes  Some recent data might be hidden    Patient was called for follow up after her procedure on 09/16/2016. No answer at the number given for follow up phone call. A message was left on the answering machine.

## 2016-09-17 NOTE — Telephone Encounter (Signed)
  Follow up Call-  Call back number 09/16/2016  Post procedure Call Back phone  # [phone]  Permission to leave phone message Yes  Some recent data might be hidden     Patient questions:  Do you have a fever, pain , or abdominal swelling? No. Pain Score  0 *  Have you tolerated food without any problems? Yes.    Have you been able to return to your normal activities? Yes.    Do you have any questions about your discharge instructions: Diet   No. Medications  No. Follow up visit  No.  Do you have questions or concerns about your Care? No.  Actions: * If pain score is 4 or above: No action needed, pain <4.

## 2016-09-22 ENCOUNTER — Encounter: Payer: Self-pay | Admitting: Internal Medicine

## 2016-11-10 ENCOUNTER — Ambulatory Visit: Payer: No Typology Code available for payment source | Admitting: Internal Medicine

## 2018-06-26 DIAGNOSIS — S8262XA Displaced fracture of lateral malleolus of left fibula, initial encounter for closed fracture: Secondary | ICD-10-CM | POA: Diagnosis not present

## 2018-06-28 DIAGNOSIS — M25572 Pain in left ankle and joints of left foot: Secondary | ICD-10-CM | POA: Diagnosis not present

## 2018-06-28 DIAGNOSIS — S8265XA Nondisplaced fracture of lateral malleolus of left fibula, initial encounter for closed fracture: Secondary | ICD-10-CM | POA: Diagnosis not present

## 2018-07-18 ENCOUNTER — Other Ambulatory Visit: Payer: Self-pay | Admitting: *Deleted

## 2018-07-18 DIAGNOSIS — Z1231 Encounter for screening mammogram for malignant neoplasm of breast: Secondary | ICD-10-CM

## 2018-08-02 ENCOUNTER — Ambulatory Visit (INDEPENDENT_AMBULATORY_CARE_PROVIDER_SITE_OTHER): Payer: BLUE CROSS/BLUE SHIELD

## 2018-08-02 ENCOUNTER — Encounter: Payer: Self-pay | Admitting: Obstetrics & Gynecology

## 2018-08-02 ENCOUNTER — Ambulatory Visit (INDEPENDENT_AMBULATORY_CARE_PROVIDER_SITE_OTHER): Payer: BLUE CROSS/BLUE SHIELD | Admitting: Obstetrics & Gynecology

## 2018-08-02 VITALS — BP 112/64 | HR 70 | Resp 16 | Ht 62.0 in | Wt 172.0 lb

## 2018-08-02 DIAGNOSIS — Z1151 Encounter for screening for human papillomavirus (HPV): Secondary | ICD-10-CM | POA: Diagnosis not present

## 2018-08-02 DIAGNOSIS — Z01419 Encounter for gynecological examination (general) (routine) without abnormal findings: Secondary | ICD-10-CM

## 2018-08-02 DIAGNOSIS — Z124 Encounter for screening for malignant neoplasm of cervix: Secondary | ICD-10-CM | POA: Diagnosis not present

## 2018-08-02 DIAGNOSIS — Z1231 Encounter for screening mammogram for malignant neoplasm of breast: Secondary | ICD-10-CM | POA: Diagnosis not present

## 2018-08-02 NOTE — Progress Notes (Signed)
Subjective:     Miranda Castro is a 57 y.o. female here for a routine exam.  Current complaints: in menopause.  Prepping to donate kidney to friend in church.     Gynecologic History No LMP recorded. Patient has had an ablation. Contraception: post menopausal status Last Pap: 2014. Results were: normal Last mammogram: 2019. Results were: pending--done today  Obstetric History OB History  Gravida Para Term Preterm AB Living  4 1   1   3   SAB TAB Ectopic Multiple Live Births               # Outcome Date GA Lbr Len/2nd Weight Sex Delivery Anes PTL Lv  4 Gravida           3 Gravida           2 Gravida           1 Preterm              The following portions of the patient's history were reviewed and updated as appropriate: allergies, current medications, past family history, past medical history, past social history, past surgical history and problem list.  Review of Systems Pertinent items noted in HPI and remainder of comprehensive ROS otherwise negative.    Objective:      Vitals:   08/02/18 1015  BP: 112/64  Pulse: 70  Resp: 16  Weight: 172 lb (78 kg)  Height: 5\' 2"  (1.575 m)   Vitals:  WNL General appearance: alert, cooperative and no distress  HEENT: Normocephalic, without obvious abnormality, atraumatic Eyes: negative Throat: lips, mucosa, and tongue normal; teeth and gums normal  Respiratory: Clear to auscultation bilaterally  CV: Regular rate and rhythm  Breasts:  Normal appearance, no masses or tenderness, no nipple retraction or dimpling  GI: Soft, non-tender; bowel sounds normal; no masses,  no organomegaly  GU: External Genitalia:  Tanner V, no lesion Urethra:  No prolapse   Vagina: Pale pink, normal rugae, no blood or discharge  Cervix: No CMT, no lesion  Uterus:  Normal size and contour, non tender  Adnexa: Normal, no masses, non tender  Musculoskeletal: No edema, redness or tenderness in the calves or thighs  Skin: No lesions or rash   Lymphatic: Axillary adenopathy: none     Psychiatric: Normal mood and behavior        Assessment:    Healthy female exam.   Menopause   Plan:   Pap with cotesting  Mammogram and Colonoscopy up to date Dexa at 10 years post menopause Calcium/Vit D

## 2018-08-04 LAB — CYTOLOGY - PAP
DIAGNOSIS: NEGATIVE
HPV: NOT DETECTED

## 2019-02-12 DIAGNOSIS — R918 Other nonspecific abnormal finding of lung field: Secondary | ICD-10-CM | POA: Diagnosis not present

## 2021-10-30 ENCOUNTER — Encounter: Payer: Self-pay | Admitting: Internal Medicine
